# Patient Record
Sex: Male | Born: 1950 | Race: White | Hispanic: No | Marital: Married
Health system: Southern US, Community
[De-identification: ages and names within clinical notes are randomized; demographics above are authoritative.]

## PROBLEM LIST (undated history)

## (undated) DIAGNOSIS — I824Z3 Acute embolism and thrombosis of unspecified deep veins of distal lower extremity, bilateral: Secondary | ICD-10-CM

## (undated) DIAGNOSIS — J9621 Acute and chronic respiratory failure with hypoxia: Secondary | ICD-10-CM

## (undated) DIAGNOSIS — N170 Acute kidney failure with tubular necrosis: Secondary | ICD-10-CM

## (undated) DIAGNOSIS — G931 Anoxic brain damage, not elsewhere classified: Secondary | ICD-10-CM

## (undated) DIAGNOSIS — I619 Nontraumatic intracerebral hemorrhage, unspecified: Secondary | ICD-10-CM

---

## 2018-10-19 ENCOUNTER — Institutional Professional Consult (permissible substitution) (HOSPITAL_COMMUNITY): Payer: Medicare Other

## 2018-10-19 ENCOUNTER — Inpatient Hospital Stay
Admission: AD | Admit: 2018-10-19 | Discharge: 2018-11-24 | Disposition: A | Discharge status: Skilled Nursing Facility | Payer: Medicare Other | Source: Other Acute Inpatient Hospital | Attending: Internal Medicine | Admitting: Internal Medicine

## 2018-10-19 ENCOUNTER — Other Ambulatory Visit (HOSPITAL_COMMUNITY): Payer: Medicare Other

## 2018-10-19 DIAGNOSIS — G931 Anoxic brain damage, not elsewhere classified: Secondary | ICD-10-CM | POA: Diagnosis not present

## 2018-10-19 DIAGNOSIS — R042 Hemoptysis: Secondary | ICD-10-CM

## 2018-10-19 DIAGNOSIS — J9690 Respiratory failure, unspecified, unspecified whether with hypoxia or hypercapnia: Secondary | ICD-10-CM

## 2018-10-19 DIAGNOSIS — N170 Acute kidney failure with tubular necrosis: Secondary | ICD-10-CM

## 2018-10-19 DIAGNOSIS — N179 Acute kidney failure, unspecified: Secondary | ICD-10-CM

## 2018-10-19 DIAGNOSIS — I824Z3 Acute embolism and thrombosis of unspecified deep veins of distal lower extremity, bilateral: Secondary | ICD-10-CM | POA: Diagnosis not present

## 2018-10-19 DIAGNOSIS — J9621 Acute and chronic respiratory failure with hypoxia: Secondary | ICD-10-CM | POA: Diagnosis not present

## 2018-10-19 DIAGNOSIS — J189 Pneumonia, unspecified organism: Secondary | ICD-10-CM

## 2018-10-19 DIAGNOSIS — Z931 Gastrostomy status: Secondary | ICD-10-CM

## 2018-10-19 DIAGNOSIS — I619 Nontraumatic intracerebral hemorrhage, unspecified: Secondary | ICD-10-CM

## 2018-10-19 HISTORY — DX: Acute kidney failure with tubular necrosis: N17.0

## 2018-10-19 HISTORY — DX: Anoxic brain damage, not elsewhere classified: G93.1

## 2018-10-19 HISTORY — DX: Acute and chronic respiratory failure with hypoxia: J96.21

## 2018-10-19 HISTORY — DX: Nontraumatic intracerebral hemorrhage, unspecified: I61.9

## 2018-10-19 HISTORY — DX: Acute embolism and thrombosis of unspecified deep veins of distal lower extremity, bilateral: I82.4Z3

## 2018-10-19 LAB — URINALYSIS, ROUTINE W REFLEX MICROSCOPIC
Bilirubin Urine: NEGATIVE
Glucose, UA: NEGATIVE mg/dL
Ketones, ur: NEGATIVE mg/dL
Nitrite: POSITIVE — AB
Protein, ur: NEGATIVE mg/dL
Specific Gravity, Urine: 1.015 (ref 1.005–1.030)
pH: 5 (ref 5.0–8.0)

## 2018-10-19 LAB — TSH: TSH: 3.752 u[IU]/mL (ref 0.350–4.500)

## 2018-10-19 MED ORDER — IOPAMIDOL (ISOVUE-300) INJECTION 61%: 50.0000 mL | Freq: Once | INTRAVENOUS | Status: DC | PRN

## 2018-10-19 MED ORDER — IOPAMIDOL (ISOVUE-300) INJECTION 61%
INTRAVENOUS | Status: AC
  Administered 2018-10-19: 50 mL via GASTROSTOMY
  Filled 2018-10-19: qty 50

## 2018-10-19 NOTE — Consult Note (Signed)
[facility]  Date of Service: 10/19/2018  PULMONARY CRITICAL CARE CONSULT   Darryl Vargas  [id number]  DOB: 26-Aug-1951   DOA: 10/19/2018  Referring Physician: Carron CurieAli Hijazi, MD  HPI: Darryl KronerCharles D Vargas is a 67 y.o. male seen for follow up of Acute on Chronic Respiratory Failure.  Patient transferred to our facility for further management and weaning.  [facility].  Apparently patient was witnessed by bystanders brought to the emergency department at that time was noted to have a right-sided hemiplegia.  Patient was initially intubated for basically airway protection.  Head CT revealed intraparenchymal hemorrhage and hydrocephalus.  There was significant cerebral edema and herniation so the patient was treated with hypertonic saline as well as mannitol.  Patient did have a EVD placed for reduction of hydrocephalus.  And also underwent evacuation of the intracerebral hemorrhage.  Currently he is on the ventilator he did not wean off the ventilator and eventually ended up needing to be undergoing a tracheostomy.  Review of Systems:  ROS performed and is unremarkable other than noted above.  Past medical history  acute stroke  hypertension Encephalopathy Acute renal failure DVT Chronic anemia Hypernatremia  Past surgical history EVD placement Tracheostomy Drainage of intraparenchymal bleed  Family history Is noncontributory.  Social history Unable to obtain patient is nonverbal.  Medications: Reviewed on Rounds  Physical Exam:  Vitals: Temperature 100.2 pulse 83 respiratory rate 34 blood pressure 134/63 saturations 96%  Ventilator Settings mode ventilation pressure assist control FiO2 35% tidal volume 520 PEEP 5 inspiratory pressure 12  . General: Comfortable at this time . Eyes: Grossly normal lids, irises & conjunctiva . ENT: grossly tongue is  normal . Neck: no obvious mass . Cardiovascular: S1-S2 normal no gallop or rub . Respiratory: Coarse breath sounds few rhonchi . Abdomen: Soft nontender . Skin: no rash seen on limited exam . Musculoskeletal: not rigid . Psychiatric:unable to assess . Neurologic: no seizure no involuntary movements         Labs on Admission:  Basic Metabolic Panel: No results for input(s): NA, K, CL, CO2, GLUCOSE, BUN, CREATININE, CALCIUM, MG, PHOS in the last 168 hours.  No results for input(s): PHART, PCO2ART, PO2ART, HCO3, O2SAT in the last 168 hours.  Liver Function Tests: No results for input(s): AST, ALT, ALKPHOS, BILITOT, PROT, ALBUMIN in the last 168 hours. No results for input(s): LIPASE, AMYLASE in the last 168 hours. No results for input(s): AMMONIA in the last 168 hours.  CBC: No results for input(s): WBC, NEUTROABS, HGB, HCT, MCV, PLT in the last 168 hours.  Cardiac Enzymes: No results for input(s): CKTOTAL, CKMB, CKMBINDEX, TROPONINI in the last 168 hours.  BNP (last 3 results) No results for input(s): BNP in the last 8760 hours.  ProBNP (last 3 results) No results for input(s): PROBNP in the last 8760 hours.   Radiological Exams on Admission: No results found.  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 Acute on chronic respiratory failure with hypoxia patient right now has a #8 Shiley trach in place.  Remains on the ventilator and full support pressure control.  Respiratory therapy will assess weaning parameters and begin the weaning if at all  able to tolerate. 2. Acute stroke with intraparenchymal hemorrhage patient right now remains unresponsive.  He is going to need physical therapy once he is more awake. 3. Acute renal failure we  will continue to monitor his labs closely. 4. Bilateral DVT patient was not a candidate for anticoagulation so had a IVC filter placed 5. Encephalopathy patient remains essentially nonverbal nonresponsive.  I have personally seen and evaluated the patient, evaluated laboratory and imaging results, formulated the assessment and plan and placed orders. The Patient requires high complexity decision making for assessment and support.  Case was discussed on Rounds with the Respiratory Therapy Staff Time Spent 70minutes  Yevonne PaxSaadat A Khan, MD [facility] Pulmonary Critical Care Medicine Sleep Medicine

## 2018-10-20 ENCOUNTER — Encounter: Payer: Self-pay | Admitting: Internal Medicine

## 2018-10-20 DIAGNOSIS — I824Z3 Acute embolism and thrombosis of unspecified deep veins of distal lower extremity, bilateral: Secondary | ICD-10-CM | POA: Diagnosis present

## 2018-10-20 DIAGNOSIS — G931 Anoxic brain damage, not elsewhere classified: Secondary | ICD-10-CM | POA: Diagnosis not present

## 2018-10-20 DIAGNOSIS — N170 Acute kidney failure with tubular necrosis: Secondary | ICD-10-CM | POA: Diagnosis not present

## 2018-10-20 DIAGNOSIS — I619 Nontraumatic intracerebral hemorrhage, unspecified: Secondary | ICD-10-CM | POA: Diagnosis present

## 2018-10-20 DIAGNOSIS — J9621 Acute and chronic respiratory failure with hypoxia: Secondary | ICD-10-CM | POA: Diagnosis present

## 2018-10-20 LAB — COMPREHENSIVE METABOLIC PANEL
ALT: 149 U/L — ABNORMAL HIGH (ref 0–44)
AST: 98 U/L — ABNORMAL HIGH (ref 15–41)
Albumin: 2.4 g/dL — ABNORMAL LOW (ref 3.5–5.0)
Alkaline Phosphatase: 70 U/L (ref 38–126)
BUN: 97 mg/dL — ABNORMAL HIGH (ref 8–23)
CALCIUM: 9.3 mg/dL (ref 8.9–10.3)
CO2: 16 mmol/L — ABNORMAL LOW (ref 22–32)
CREATININE: 1.65 mg/dL — AB (ref 0.61–1.24)
Chloride: >130 mmol/L (ref 98–111)
GFR calc Af Amer: 50 mL/min — ABNORMAL LOW (ref >60)
GFR, EST NON AFRICAN AMERICAN: 43 mL/min — AB (ref >60)
Glucose, Bld: 178 mg/dL — ABNORMAL HIGH (ref 70–99)
Potassium: 4.9 mmol/L (ref 3.5–5.1)
Sodium: 160 mmol/L — ABNORMAL HIGH (ref 135–145)
Total Bilirubin: 0.6 mg/dL (ref 0.3–1.2)
Total Protein: 6.3 g/dL — ABNORMAL LOW (ref 6.5–8.1)

## 2018-10-20 LAB — PROTIME-INR
INR: 1.35
Prothrombin Time: 16.5 seconds — ABNORMAL HIGH (ref 11.4–15.2)

## 2018-10-20 LAB — CBC
HCT: 36.2 % — ABNORMAL LOW (ref 39.0–52.0)
Hemoglobin: 11.2 g/dL — ABNORMAL LOW (ref 13.0–17.0)
MCH: 30.9 pg (ref 26.0–34.0)
MCHC: 30.9 g/dL (ref 30.0–36.0)
MCV: 100 fL (ref 80.0–100.0)
PLATELETS: 276 10*3/uL (ref 150–400)
RBC: 3.62 MIL/uL — ABNORMAL LOW (ref 4.22–5.81)
RDW: 14.7 % (ref 11.5–15.5)
WBC: 12.1 10*3/uL — ABNORMAL HIGH (ref 4.0–10.5)
nRBC: 0 % (ref 0.0–0.2)

## 2018-10-20 LAB — BLOOD GAS, ARTERIAL
Acid-base deficit: 9.9 mmol/L — ABNORMAL HIGH (ref 0.0–2.0)
Bicarbonate: 14.2 mmol/L — ABNORMAL LOW (ref 20.0–28.0)
FIO2: 35
O2 Saturation: 94.5 %
PEEP: 5 cmH2O
Patient temperature: 98.6
Pressure control: 33 cmH2O
RATE: 12 resp/min
pCO2 arterial: 24.8 mmHg — ABNORMAL LOW (ref 32.0–48.0)
pH, Arterial: 7.377 (ref 7.350–7.450)
pO2, Arterial: 75.6 mmHg — ABNORMAL LOW (ref 83.0–108.0)

## 2018-10-20 NOTE — Progress Notes (Signed)
Pulmonary Critical Care Medicine [place]   PULMONARY CRITICAL CARE SERVICE  PROGRESS NOTE  Date of Service: 10/20/2018  HJALMAR BALLENGEE  VQM:[id number]  DOB: May 27, 1951   DOA: 10/19/2018  Referring Physician: Merton Border, MD  HPI: DOMINICO ROD is a 67 y.o. male seen for follow up of Acute on Chronic Respiratory Failure.  Patient is on full vent support.  Still somewhat tachypneic.  Patient is at a poor spontaneous breathing trial not able to wean.  Remains on pressure assist control mode  Medications: Reviewed on Rounds  Physical Exam:  Vitals: Temperature 98.9 pulse 75 respiratory 35 blood pressure 160/86 saturations 94%  Ventilator Settings mode ventilation pressure assist control FiO2 30% tidal volume 458 PEEP 5  . General: Comfortable at this time . Eyes: Grossly normal lids, irises & conjunctiva . ENT: grossly tongue is normal . Neck: no obvious mass . Cardiovascular: S1 S2 normal no gallop . Respiratory: No rhonchi or rales are noted at this time . Abdomen: soft . Skin: no rash seen on limited exam . Musculoskeletal: not rigid . Psychiatric:unable to assess . Neurologic: no seizure no involuntary movements         Lab Data:   Basic Metabolic Panel: Recent Labs  Lab 10/20/18 0700  NA 160*  K 4.9  CL >130*  CO2 16*  GLUCOSE 178*  BUN 97*  CREATININE 1.65*  CALCIUM 9.3    ABG: Recent Labs  Lab 10/19/18 1530  PHART 7.377  PCO2ART 24.8*  PO2ART 75.6*  HCO3 14.2*  O2SAT 94.5    Liver Function Tests: Recent Labs  Lab 10/20/18 0700  AST 98*  ALT 149*  ALKPHOS 70  BILITOT 0.6  PROT 6.3*  ALBUMIN 2.4*   No results for input(s): LIPASE, AMYLASE in the last 168 hours. No results for input(s): AMMONIA in the last 168 hours.  CBC: Recent Labs  Lab 10/20/18 0700  WBC 12.1*  HGB 11.2*  HCT 36.2*  MCV 100.0  PLT 276    Cardiac Enzymes: No results for input(s): CKTOTAL, CKMB, CKMBINDEX, TROPONINI in the last 168  hours.  BNP (last 3 results) No results for input(s): BNP in the last 8760 hours.  ProBNP (last 3 results) No results for input(s): PROBNP in the last 8760 hours.  Radiological Exams: Dg Abdomen Peg Tube Location  Result Date: 10/19/2018 CLINICAL DATA:  Evaluate PEG tube. 50 cc of Isovue-300 injected through the PEG tube. EXAM: ABDOMEN - 1 VIEW COMPARISON:  None. FINDINGS: The PEG tube projects over the gastric body. The gastric fundus is filled with contrast. No evidence of leak. The contrast remains in the study and is not exited into the small bowel. IVC filter. Suspected left basilar opacity, incompletely evaluated. IMPRESSION: 1. The PEG tube appears to be located within the body of the stomach. No evidence of leak. 2. Suspected opacity in left base, incompletely evaluated. Recommend attention to this region on today's chest x-ray. Electronically Signed   By: Dorise Bullion III M.D   On: 10/19/2018 15:06   Dg Chest Port 1 View  Result Date: 10/19/2018 CLINICAL DATA:  Fever.  Evaluate tracheostomy. EXAM: PORTABLE CHEST 1 VIEW COMPARISON:  None. FINDINGS: Tracheostomy appropriately position with the tip at the thoracic inlet, 8.5 cm above the carina. The heart size and mediastinal contours are within normal limits. Normal pulmonary vascularity. Retrocardiac left lower lobe airspace disease. Trace left pleural effusion. The right lung is clear. No pneumothorax. No acute osseous abnormality. IMPRESSION: 1. Appropriately  positioned tracheostomy tube. 2. Left lower lobe atelectasis and/or infiltrate. 3. Trace left pleural effusion. Electronically Signed   By: Titus Dubin M.D.   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 Acute  on chronic respiratory failure with hypoxia patient remains on the ventilator so far weaning attempts have been met with failure.  We will continue to assess daily. 2. Nontraumatic intracranial hemorrhage at baseline remains nonverbal. 3. Acute tubular necrosis continue to monitor labs last BUN was 97 creatinine 1.65 4. DVT has IVC filter in place 5. Acute anoxic encephalopathy grossly unchanged we will continue to monitor.   I have personally seen and evaluated the patient, evaluated laboratory and imaging results, formulated the assessment and plan and placed orders. The Patient requires high complexity decision making for assessment and support.  Case was discussed on Rounds with the Respiratory Therapy Staff  Allyne Gee, MD [facility] Pulmonary Critical Care Medicine Sleep Medicine

## 2018-10-21 DIAGNOSIS — I824Z3 Acute embolism and thrombosis of unspecified deep veins of distal lower extremity, bilateral: Secondary | ICD-10-CM | POA: Diagnosis not present

## 2018-10-21 DIAGNOSIS — G931 Anoxic brain damage, not elsewhere classified: Secondary | ICD-10-CM | POA: Diagnosis not present

## 2018-10-21 DIAGNOSIS — N170 Acute kidney failure with tubular necrosis: Secondary | ICD-10-CM | POA: Diagnosis not present

## 2018-10-21 DIAGNOSIS — J9621 Acute and chronic respiratory failure with hypoxia: Secondary | ICD-10-CM | POA: Diagnosis not present

## 2018-10-21 LAB — BLOOD GAS, ARTERIAL
Acid-base deficit: 12.6 mmol/L — ABNORMAL HIGH (ref 0.0–2.0)
BICARBONATE: 12.1 mmol/L — AB (ref 20.0–28.0)
FIO2: 30
O2 Saturation: 97.8 %
PEEP/CPAP: 5 cmH2O
PH ART: 7.333 — AB (ref 7.350–7.450)
Patient temperature: 98.6
Pressure control: 24 cmH2O
RATE: 20 resp/min
pCO2 arterial: 23.5 mmHg — ABNORMAL LOW (ref 32.0–48.0)
pO2, Arterial: 106 mmHg (ref 83.0–108.0)

## 2018-10-21 LAB — URINE CULTURE: Culture: >=100000 — AB

## 2018-10-21 NOTE — Progress Notes (Signed)
[facility]   PULMONARY CRITICAL CARE SERVICE  PROGRESS NOTE  Date of Service: 10/21/2018  Darryl KronerCharles D Vargas  GNF:[id number]  DOB: 1951/08/09   DOA: 10/19/2018  Referring Physician: Carron CurieAli Hijazi, MD  HPI: Darryl KronerCharles D Darryl Vargas is a 67 y.o. male seen for follow up of Acute on Chronic Respiratory Failure.  Patient is on pressure support mode currently has been on 30% FiO2 tolerating the wean fairly well  Medications: Reviewed on Rounds  Physical Exam:  Vitals: Temperature 99.7 pulse 82 respiratory rate 33 blood pressure 132/75 saturations 95%  Ventilator Settings mode of ventilation pressure support FiO2 30% tidal volume 610 pressure support 12 PEEP 5  . General: Comfortable at this time . Eyes: Grossly normal lids, irises & conjunctiva . ENT: grossly tongue is normal . Neck: no obvious mass . Cardiovascular: S1 S2 normal no gallop . Respiratory: No rhonchi or rales are noted at this time . Abdomen: soft . Skin: no rash seen on limited exam . Musculoskeletal: not rigid . Psychiatric:unable to assess . Neurologic: no seizure no involuntary movements         Lab Data:   Basic Metabolic Panel: Recent Labs  Lab 10/20/18 0700  NA 160*  K 4.9  CL >130*  CO2 16*  GLUCOSE 178*  BUN 97*  CREATININE 1.65*  CALCIUM 9.3    ABG: Recent Labs  Lab 10/19/18 1530  PHART 7.377  PCO2ART 24.8*  PO2ART 75.6*  HCO3 14.2*  O2SAT 94.5    Liver Function Tests: Recent Labs  Lab 10/20/18 0700  AST 98*  ALT 149*  ALKPHOS 70  BILITOT 0.6  PROT 6.3*  ALBUMIN 2.4*   No results for input(s): LIPASE, AMYLASE in the last 168 hours. No results for input(s): AMMONIA in the last 168 hours.  CBC: Recent Labs  Lab 10/20/18 0700  WBC 12.1*  HGB 11.2*  HCT 36.2*  MCV 100.0  PLT 276    Cardiac Enzymes: No results for input(s): CKTOTAL, CKMB, CKMBINDEX, TROPONINI in the last 168 hours.  BNP (last 3 results) No results for  input(s): BNP in the last 8760 hours.  ProBNP (last 3 results) No results for input(s): PROBNP in the last 8760 hours.  Radiological Exams: Dg Abdomen Peg Tube Location  Result Date: 10/19/2018 CLINICAL DATA:  Evaluate PEG tube. 50 cc of Isovue-300 injected through the PEG tube. EXAM: ABDOMEN - 1 VIEW COMPARISON:  None. FINDINGS: The PEG tube projects over the gastric body. The gastric fundus is filled with contrast. No evidence of leak. The contrast remains in the study and is not exited into the small bowel. IVC filter. Suspected left basilar opacity, incompletely evaluated. IMPRESSION: 1. The PEG tube appears to be located within the body of the stomach. No evidence of leak. 2. Suspected opacity in left base, incompletely evaluated. Recommend attention to this region on today's chest x-ray. Electronically Signed   By: Gerome Samavid  Williams III M.D   On: 10/19/2018 15:06   Dg Chest Port 1 View  Result Date: 10/19/2018 CLINICAL DATA:  Fever.  Evaluate tracheostomy. EXAM: PORTABLE CHEST 1 VIEW COMPARISON:  None. FINDINGS: Tracheostomy appropriately position with the tip at the thoracic inlet, 8.5 cm above the carina. The heart size and mediastinal contours are within normal limits. Normal pulmonary vascularity. Retrocardiac left lower lobe airspace disease. Trace left pleural effusion. The right lung is clear. No pneumothorax. No acute osseous abnormality. IMPRESSION: 1. Appropriately positioned tracheostomy tube. 2. Left lower lobe atelectasis and/or  infiltrate. 3. Trace left pleural effusion. Electronically Signed   By: Obie DredgeWilliam T Derry M.D.   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 Acute on chronic respiratory failure with hypoxia we  will continue with the wean protocol 12/5 pressure support 2. Nontraumatic intracranial hemorrhage patient is at baseline needs ongoing therapy. 3. Acute tubular necrosis patient elevated BUN/creatinine continue to monitor. 4. DVT treated we will continue with supportive care. 5. Lobar pneumonia some atelectasis versus infiltrate noted on the left lower lobe.  We will monitor this radiologically   I have personally seen and evaluated the patient, evaluated laboratory and imaging results, formulated the assessment and plan and placed orders. The Patient requires high complexity decision making for assessment and support.  Case was discussed on Rounds with the Respiratory Therapy Staff  Yevonne PaxSaadat A Erlean Mealor, MD [facility] Pulmonary Critical Care Medicine Sleep Medicine

## 2018-10-22 DIAGNOSIS — G931 Anoxic brain damage, not elsewhere classified: Secondary | ICD-10-CM | POA: Diagnosis not present

## 2018-10-22 DIAGNOSIS — J9621 Acute and chronic respiratory failure with hypoxia: Secondary | ICD-10-CM | POA: Diagnosis not present

## 2018-10-22 DIAGNOSIS — N170 Acute kidney failure with tubular necrosis: Secondary | ICD-10-CM | POA: Diagnosis not present

## 2018-10-22 DIAGNOSIS — I824Z3 Acute embolism and thrombosis of unspecified deep veins of distal lower extremity, bilateral: Secondary | ICD-10-CM | POA: Diagnosis not present

## 2018-10-22 LAB — CBC
HCT: 36.5 % — ABNORMAL LOW (ref 39.0–52.0)
Hemoglobin: 11.2 g/dL — ABNORMAL LOW (ref 13.0–17.0)
MCH: 30.4 pg (ref 26.0–34.0)
MCHC: 30.7 g/dL (ref 30.0–36.0)
MCV: 99.2 fL (ref 80.0–100.0)
Platelets: 268 10*3/uL (ref 150–400)
RBC: 3.68 MIL/uL — ABNORMAL LOW (ref 4.22–5.81)
RDW: 14.6 % (ref 11.5–15.5)
WBC: 14.9 10*3/uL — ABNORMAL HIGH (ref 4.0–10.5)
nRBC: 0 % (ref 0.0–0.2)

## 2018-10-22 LAB — BLOOD GAS, ARTERIAL
Acid-base deficit: 13.5 mmol/L — ABNORMAL HIGH (ref 0.0–2.0)
Bicarbonate: 11.8 mmol/L — ABNORMAL LOW (ref 20.0–28.0)
FIO2: 0.3
O2 Saturation: 95.7 %
PEEP: 5 cmH2O
PO2 ART: 83.6 mmHg (ref 83.0–108.0)
Patient temperature: 98.6
Pressure control: 24 cmH2O
RATE: 16 resp/min
pCO2 arterial: 24.9 mmHg — ABNORMAL LOW (ref 32.0–48.0)
pH, Arterial: 7.295 — ABNORMAL LOW (ref 7.350–7.450)

## 2018-10-22 LAB — BASIC METABOLIC PANEL
Anion gap: 17 — ABNORMAL HIGH (ref 5–15)
BUN: 164 mg/dL — ABNORMAL HIGH (ref 8–23)
CO2: 13 mmol/L — ABNORMAL LOW (ref 22–32)
Calcium: 9 mg/dL (ref 8.9–10.3)
Chloride: 121 mmol/L — ABNORMAL HIGH (ref 98–111)
Creatinine, Ser: 4.22 mg/dL — ABNORMAL HIGH (ref 0.61–1.24)
GFR calc Af Amer: 16 mL/min — ABNORMAL LOW (ref >60)
GFR calc non Af Amer: 14 mL/min — ABNORMAL LOW (ref >60)
GLUCOSE: 198 mg/dL — AB (ref 70–99)
Potassium: 6.6 mmol/L (ref 3.5–5.1)
Sodium: 151 mmol/L — ABNORMAL HIGH (ref 135–145)

## 2018-10-22 NOTE — Progress Notes (Signed)
[facility]   PULMONARY CRITICAL CARE SERVICE  PROGRESS NOTE  Date of Service: 10/22/2018  Darryl KronerCharles D Daris  ZOX:[id number]  DOB: 1951/08/21   DOA: 10/19/2018  Referring Physician: Carron CurieAli Hijazi, MD  HPI: Darryl Vargas is a 67 y.o. male seen for follow up of Acute on Chronic Respiratory Failure.  Patient had a difficult night overnight is on pressure control mode right now is on 30% oxygen with a PEEP of 5  Medications: Reviewed on Rounds  Physical Exam:  Vitals: Temperature 98.6 pulse 78 respiratory 28 blood pressure 101/62 saturations 96%  Ventilator Settings mode ventilation pressure assist control FiO2 30% tidal volume 800 PEEP 5  . General: Comfortable at this time . Eyes: Grossly normal lids, irises & conjunctiva . ENT: grossly tongue is normal . Neck: no obvious mass . Cardiovascular: S1 S2 normal no gallop . Respiratory: Coarse breath sounds noted bilaterally . Abdomen: soft . Skin: no rash seen on limited exam . Musculoskeletal: not rigid . Psychiatric:unable to assess . Neurologic: no seizure no involuntary movements         Lab Data:   Basic Metabolic Panel: Recent Labs  Lab 10/20/18 0700 10/22/18 0651  NA 160* 151*  K 4.9 6.6*  CL >130* 121*  CO2 16* 13*  GLUCOSE 178* 198*  BUN 97* 164*  CREATININE 1.65* 4.22*  CALCIUM 9.3 9.0    ABG: Recent Labs  Lab 10/19/18 1530 10/21/18 2126 10/22/18 0410  PHART 7.377 7.333* 7.295*  PCO2ART 24.8* 23.5* 24.9*  PO2ART 75.6* 106 83.6  HCO3 14.2* 12.1* 11.8*  O2SAT 94.5 97.8 95.7    Liver Function Tests: Recent Labs  Lab 10/20/18 0700  AST 98*  ALT 149*  ALKPHOS 70  BILITOT 0.6  PROT 6.3*  ALBUMIN 2.4*   No results for input(s): LIPASE, AMYLASE in the last 168 hours. No results for input(s): AMMONIA in the last 168 hours.  CBC: Recent Labs  Lab 10/20/18 0700 10/22/18 0651  WBC 12.1* 14.9*  HGB 11.2* 11.2*  HCT 36.2* 36.5*  MCV  100.0 99.2  PLT 276 268    Cardiac Enzymes: No results for input(s): CKTOTAL, CKMB, CKMBINDEX, TROPONINI in the last 168 hours.  BNP (last 3 results) No results for input(s): BNP in the last 8760 hours.  ProBNP (last 3 results) No results for input(s): PROBNP in the last 8760 hours.  Radiological Exams: No results found.  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 Acute on chronic respiratory failure with hypoxia patient is still significantly dehydrated with elevated sodium as well as BUN and creatinine.  The patient is being hydrated.  Also was noted to have urinary flow obstruction relieved by catheterization.  We will continue with supportive care hopefully we should see an improvement in the blood gases as the acidosis improves from the renal issues 2. Nontraumatic intracranial hemorrhage continue with supportive care patient is nonverbal. 3. Acute tubular necrosis likely obstructive uropathy 4. DVT treated 5. Acute anoxic encephalopathy grossly unchanged we will continue to monitor   I have personally seen and evaluated the patient, evaluated laboratory and imaging results, formulated the assessment and plan and placed orders. The Patient requires high complexity decision making for assessment and support.  Case was discussed on  Rounds with the Respiratory Therapy Staff  Allyne Gee, MD [facility] Pulmonary Critical Care Medicine Sleep Medicine

## 2018-10-23 DIAGNOSIS — J9621 Acute and chronic respiratory failure with hypoxia: Secondary | ICD-10-CM | POA: Diagnosis not present

## 2018-10-23 DIAGNOSIS — G931 Anoxic brain damage, not elsewhere classified: Secondary | ICD-10-CM | POA: Diagnosis not present

## 2018-10-23 DIAGNOSIS — I824Z3 Acute embolism and thrombosis of unspecified deep veins of distal lower extremity, bilateral: Secondary | ICD-10-CM | POA: Diagnosis not present

## 2018-10-23 DIAGNOSIS — N170 Acute kidney failure with tubular necrosis: Secondary | ICD-10-CM | POA: Diagnosis not present

## 2018-10-23 LAB — BASIC METABOLIC PANEL
Anion gap: 17 — ABNORMAL HIGH (ref 5–15)
BUN: 178 mg/dL — ABNORMAL HIGH (ref 8–23)
CO2: 14 mmol/L — AB (ref 22–32)
Calcium: 8.6 mg/dL — ABNORMAL LOW (ref 8.9–10.3)
Chloride: 117 mmol/L — ABNORMAL HIGH (ref 98–111)
Creatinine, Ser: 4.29 mg/dL — ABNORMAL HIGH (ref 0.61–1.24)
GFR calc Af Amer: 16 mL/min — ABNORMAL LOW (ref >60)
GFR calc non Af Amer: 14 mL/min — ABNORMAL LOW (ref >60)
Glucose, Bld: 198 mg/dL — ABNORMAL HIGH (ref 70–99)
Potassium: 4.3 mmol/L (ref 3.5–5.1)
Sodium: 148 mmol/L — ABNORMAL HIGH (ref 135–145)

## 2018-10-23 NOTE — Progress Notes (Signed)
[facility]   PULMONARY CRITICAL CARE SERVICE  PROGRESS NOTE  Date of Service: 10/23/2018  Darryl Vargas  ZOX:[id number]  DOB: 1950-12-09   DOA: 10/19/2018  Referring Physician: Carron CurieAli Hijazi, MD  HPI: Darryl Vargas is a 67 y.o. male seen for follow up of Acute on Chronic Respiratory Failure.  Patient is currently on pressure support mode has been on 30% FiO2.  Has intermittently been tolerating weaning.  He still has issues with his renal function nephrology needs to see the patient consultation was requested  Medications: Reviewed on Rounds  Physical Exam:  Vitals: Temperature 98.1 pulse 73 respiratory rate 24 blood pressure 135/73 saturations 96%  Ventilator Settings mode ventilation pressure support FiO2 30% pressure support 12 PEEP 5  . General: Comfortable at this time . Eyes: Grossly normal lids, irises & conjunctiva . ENT: grossly tongue is normal . Neck: no obvious mass . Cardiovascular: S1 S2 normal no gallop . Respiratory: No rhonchi or rales are noted at this time . Abdomen: soft . Skin: no rash seen on limited exam . Musculoskeletal: not rigid . Psychiatric:unable to assess . Neurologic: no seizure no involuntary movements         Lab Data:   Basic Metabolic Panel: Recent Labs  Lab 10/20/18 0700 10/22/18 0651 10/23/18 0430  NA 160* 151* 148*  K 4.9 6.6* 4.3  CL >130* 121* 117*  CO2 16* 13* 14*  GLUCOSE 178* 198* 198*  BUN 97* 164* 178*  CREATININE 1.65* 4.22* 4.29*  CALCIUM 9.3 9.0 8.6*    ABG: Recent Labs  Lab 10/19/18 1530 10/21/18 2126 10/22/18 0410  PHART 7.377 7.333* 7.295*  PCO2ART 24.8* 23.5* 24.9*  PO2ART 75.6* 106 83.6  HCO3 14.2* 12.1* 11.8*  O2SAT 94.5 97.8 95.7    Liver Function Tests: Recent Labs  Lab 10/20/18 0700  AST 98*  ALT 149*  ALKPHOS 70  BILITOT 0.6  PROT 6.3*  ALBUMIN 2.4*   No results for input(s): LIPASE, AMYLASE in the last 168 hours. No  results for input(s): AMMONIA in the last 168 hours.  CBC: Recent Labs  Lab 10/20/18 0700 10/22/18 0651  WBC 12.1* 14.9*  HGB 11.2* 11.2*  HCT 36.2* 36.5*  MCV 100.0 99.2  PLT 276 268    Cardiac Enzymes: No results for input(s): CKTOTAL, CKMB, CKMBINDEX, TROPONINI in the last 168 hours.  BNP (last 3 results) No results for input(s): BNP in the last 8760 hours.  ProBNP (last 3 results) No results for input(s): PROBNP in the last 8760 hours.  Radiological Exams: No results found.  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 Acute on chronic respiratory failure with hypoxia we will continue with pressure support mode currently on 30% FiO2 wean as tolerated. 2. Nontraumatic intracranial hemorrhage we will continue with supportive care physical therapy 3. Acute tubular necrosis patient's BUN and creatinine are still going up nephrology consultations been requested. 4. DVT treated 5. Anoxic encephalopathy grossly unchanged   I have personally seen and evaluated the patient, evaluated laboratory and imaging results, formulated the assessment and plan and placed orders. The Patient requires high complexity decision making for assessment and support.  Case was discussed on Rounds with the Respiratory Therapy Staff  Yevonne PaxSaadat A , MD  [facility] Pulmonary Critical Care Medicine Sleep Medicine

## 2018-10-24 DIAGNOSIS — G931 Anoxic brain damage, not elsewhere classified: Secondary | ICD-10-CM | POA: Diagnosis not present

## 2018-10-24 DIAGNOSIS — I824Z3 Acute embolism and thrombosis of unspecified deep veins of distal lower extremity, bilateral: Secondary | ICD-10-CM | POA: Diagnosis not present

## 2018-10-24 DIAGNOSIS — J9621 Acute and chronic respiratory failure with hypoxia: Secondary | ICD-10-CM | POA: Diagnosis not present

## 2018-10-24 DIAGNOSIS — N170 Acute kidney failure with tubular necrosis: Secondary | ICD-10-CM | POA: Diagnosis not present

## 2018-10-24 LAB — BASIC METABOLIC PANEL
Anion gap: 14 (ref 5–15)
BUN: 172 mg/dL — ABNORMAL HIGH (ref 8–23)
CHLORIDE: 120 mmol/L — AB (ref 98–111)
CO2: 15 mmol/L — ABNORMAL LOW (ref 22–32)
Calcium: 8.4 mg/dL — ABNORMAL LOW (ref 8.9–10.3)
Creatinine, Ser: 3.76 mg/dL — ABNORMAL HIGH (ref 0.61–1.24)
GFR calc Af Amer: 18 mL/min — ABNORMAL LOW (ref >60)
GFR calc non Af Amer: 16 mL/min — ABNORMAL LOW (ref >60)
Glucose, Bld: 185 mg/dL — ABNORMAL HIGH (ref 70–99)
Potassium: 4.3 mmol/L (ref 3.5–5.1)
Sodium: 149 mmol/L — ABNORMAL HIGH (ref 135–145)

## 2018-10-24 NOTE — Progress Notes (Signed)
[facility] [place]   PULMONARY CRITICAL CARE SERVICE  PROGRESS NOTE  Date of Service: 10/24/2018  Darryl Vargas  [id number]  DOB: [birth year]   DOA: 10/19/2018  Referring Physician: Carron CurieAli Hijazi, MD  HPI: Darryl Vargas is a 67 y.o. male seen for follow up of Acute on Chronic Respiratory Failure.  Patient is resting comfortably without distress at this time.  Is weaning today on pressure support doing a little bit better.  Right now is on 28% FiO2  Medications: Reviewed on Rounds  Physical Exam:  Vitals: Pitcher 97.7 pulse 77 respiratory 29 blood pressure 116/56 saturations 94%  Ventilator Settings mode of ventilation pressure support FiO2 28% tidal volume 400 pressure support 12 PEEP 5  . General: Comfortable at this time . Eyes: Grossly normal lids, irises & conjunctiva . ENT: grossly tongue is normal . Neck: no obvious mass . Cardiovascular: S1 S2 normal no gallop . Respiratory: No rhonchi or rales are noted at this time . Abdomen: soft . Skin: no rash seen on limited exam . Musculoskeletal: not rigid . Psychiatric:unable to assess . Neurologic: no seizure no involuntary movements         Lab Data:   Basic Metabolic Panel: Recent Labs  Lab 10/20/18 0700 10/22/18 0651 10/23/18 0430 10/24/18 0521  NA 160* 151* 148* 149*  K 4.9 6.6* 4.3 4.3  CL >130* 121* 117* 120*  CO2 16* 13* 14* 15*  GLUCOSE 178* 198* 198* 185*  BUN 97* 164* 178* 172*  CREATININE 1.65* 4.22* 4.29* 3.76*  CALCIUM 9.3 9.0 8.6* 8.4*    ABG: Recent Labs  Lab 10/19/18 1530 10/21/18 2126 10/22/18 0410  PHART 7.377 7.333* 7.295*  PCO2ART 24.8* 23.5* 24.9*  PO2ART 75.6* 106 83.6  HCO3 14.2* 12.1* 11.8*  O2SAT 94.5 97.8 95.7    Liver Function Tests: Recent Labs  Lab 10/20/18 0700  AST 98*  ALT 149*  ALKPHOS 70  BILITOT 0.6  PROT 6.3*  ALBUMIN 2.4*   No results for input(s): LIPASE, AMYLASE in the last 168 hours. No results  for input(s): AMMONIA in the last 168 hours.  CBC: Recent Labs  Lab 10/20/18 0700 10/22/18 0651  WBC 12.1* 14.9*  HGB 11.2* 11.2*  HCT 36.2* 36.5*  MCV 100.0 99.2  PLT 276 268    Cardiac Enzymes: No results for input(s): CKTOTAL, CKMB, CKMBINDEX, TROPONINI in the last 168 hours.  BNP (last 3 results) No results for input(s): BNP in the last 8760 hours.  ProBNP (last 3 results) No results for input(s): PROBNP in the last 8760 hours.  Radiological Exams: No results found.  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 Acute on chronic respiratory failure with hypoxia we will continue with the wean the goal is for 12 hours today 2. Nontraumatic intracranial hemorrhage at baseline continue with supportive care 3. Acute tubular necrosis labs appear to be improving 4. DVT treated 5. Anoxic encephalopathy grossly unchanged   I have personally seen and evaluated the patient, evaluated laboratory and imaging results, formulated the assessment and plan and placed orders. The Patient requires high complexity decision making for assessment and support.  Case was discussed on Rounds with the Respiratory Therapy Staff  Yevonne PaxSaadat A Khan, MD [facility] Pulmonary Critical Care Medicine  Sleep Medicine

## 2018-10-25 DIAGNOSIS — N170 Acute kidney failure with tubular necrosis: Secondary | ICD-10-CM | POA: Diagnosis not present

## 2018-10-25 DIAGNOSIS — G931 Anoxic brain damage, not elsewhere classified: Secondary | ICD-10-CM | POA: Diagnosis not present

## 2018-10-25 DIAGNOSIS — I824Z3 Acute embolism and thrombosis of unspecified deep veins of distal lower extremity, bilateral: Secondary | ICD-10-CM | POA: Diagnosis not present

## 2018-10-25 DIAGNOSIS — J9621 Acute and chronic respiratory failure with hypoxia: Secondary | ICD-10-CM | POA: Diagnosis not present

## 2018-10-25 LAB — BASIC METABOLIC PANEL
Anion gap: 9 (ref 5–15)
BUN: 159 mg/dL — ABNORMAL HIGH (ref 8–23)
CO2: 19 mmol/L — AB (ref 22–32)
Calcium: 8.7 mg/dL — ABNORMAL LOW (ref 8.9–10.3)
Chloride: 122 mmol/L — ABNORMAL HIGH (ref 98–111)
Creatinine, Ser: 3.03 mg/dL — ABNORMAL HIGH (ref 0.61–1.24)
GFR calc Af Amer: 24 mL/min — ABNORMAL LOW (ref >60)
GFR calc non Af Amer: 21 mL/min — ABNORMAL LOW (ref >60)
Glucose, Bld: 184 mg/dL — ABNORMAL HIGH (ref 70–99)
Potassium: 4.2 mmol/L (ref 3.5–5.1)
Sodium: 150 mmol/L — ABNORMAL HIGH (ref 135–145)

## 2018-10-25 NOTE — Progress Notes (Addendum)
Pulmonary Critical Care Medicine [facility] [place]   PULMONARY CRITICAL CARE SERVICE  PROGRESS NOTE  Date of Service: 10/25/2018  Darryl Vargas  [id number]:[id number]  DOB: 31-May-1951   DOA: 10/19/2018  Referring Physician: Carron Curie, MD  HPI: Darryl Vargas is a 68 y.o. male seen for follow up of Acute on Chronic Respiratory Failure.  Patient is doing well at this time on pressure support.  His goal today is 16 hours and currently he is doing well.  Minimal secretions noted.  Current FiO2 28%.  Medications: Reviewed on Rounds  Physical Exam:  Vitals: Pulse 70 respirations 25 blood pressure 140/72 O2 sat 95% temp 96.1  Ventilator Settings PSVT 12/5 FiO2 28%.  . General: Comfortable at this time . Eyes: Grossly normal lids, irises & conjunctiva . ENT: grossly tongue is normal . Neck: no obvious mass . Cardiovascular: S1 S2 normal no gallop . Respiratory: No wheezes or rhonchi noted . Abdomen: soft . Skin: no rash seen on limited exam . Musculoskeletal: not rigid . Psychiatric:unable to assess . Neurologic: no seizure no involuntary movements         Lab Data:   Basic Metabolic Panel: Recent Labs  Lab 10/20/18 0700 10/22/18 0651 10/23/18 0430 10/24/18 0521 10/25/18 0557  NA 160* 151* 148* 149* 150*  K 4.9 6.6* 4.3 4.3 4.2  CL >130* 121* 117* 120* 122*  CO2 16* 13* 14* 15* 19*  GLUCOSE 178* 198* 198* 185* 184*  BUN 97* 164* 178* 172* 159*  CREATININE 1.65* 4.22* 4.29* 3.76* 3.03*  CALCIUM 9.3 9.0 8.6* 8.4* 8.7*    ABG: Recent Labs  Lab 10/19/18 1530 10/21/18 2126 10/22/18 0410  PHART 7.377 7.333* 7.295*  PCO2ART 24.8* 23.5* 24.9*  PO2ART 75.6* 106 83.6  HCO3 14.2* 12.1* 11.8*  O2SAT 94.5 97.8 95.7    Liver Function Tests: Recent Labs  Lab 10/20/18 0700  AST 98*  ALT 149*  ALKPHOS 70  BILITOT 0.6  PROT 6.3*  ALBUMIN 2.4*   No results for input(s): LIPASE, AMYLASE in the last 168 hours. No results for input(s): AMMONIA in the  last 168 hours.  CBC: Recent Labs  Lab 10/20/18 0700 10/22/18 0651  WBC 12.1* 14.9*  HGB 11.2* 11.2*  HCT 36.2* 36.5*  MCV 100.0 99.2  PLT 276 268    Cardiac Enzymes: No results for input(s): CKTOTAL, CKMB, CKMBINDEX, TROPONINI in the last 168 hours.  BNP (last 3 results) No results for input(s): BNP in the last 8760 hours.  ProBNP (last 3 results) No results for input(s): PROBNP in the last 8760 hours.  Radiological Exams: No results found.  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 Acute on chronic respiratory failure with hypoxia continue weaning per protocol.  Goal today 16 hours on pressure support. 2. Nontraumatic intracranial hemorrhage at baseline continue supportive care 3. Acute tubular necrosis labs appear to be improving 4. DVT treated 5. Anorexic encephalopathy grossly unchanged   I have personally seen and evaluated the patient, evaluated laboratory and imaging results, formulated the assessment and plan and placed orders. The Patient requires high complexity decision making for assessment and support.  Case was discussed on Rounds with the Respiratory Therapy Staff  Yevonne Pax, MD [facility] Pulmonary Critical Care Medicine Sleep Medicine

## 2018-10-26 DIAGNOSIS — G931 Anoxic brain damage, not elsewhere classified: Secondary | ICD-10-CM | POA: Diagnosis not present

## 2018-10-26 DIAGNOSIS — N170 Acute kidney failure with tubular necrosis: Secondary | ICD-10-CM | POA: Diagnosis not present

## 2018-10-26 DIAGNOSIS — I824Z3 Acute embolism and thrombosis of unspecified deep veins of distal lower extremity, bilateral: Secondary | ICD-10-CM | POA: Diagnosis not present

## 2018-10-26 DIAGNOSIS — J9621 Acute and chronic respiratory failure with hypoxia: Secondary | ICD-10-CM | POA: Diagnosis not present

## 2018-10-26 LAB — SODIUM: Sodium: 151 mmol/L — ABNORMAL HIGH (ref 135–145)

## 2018-10-26 NOTE — Progress Notes (Signed)
Pulmonary Critical Care Medicine [facility] [place]   PULMONARY CRITICAL CARE SERVICE  PROGRESS NOTE  Date of Service: 10/26/2018  JAVONTE TONJES  DJM:[id number]  DOB: 10-08-1951   DOA: 10/19/2018  Referring Physician: Carron Curie, MD  HPI: Darryl Vargas is a 68 y.o. male seen for follow up of Acute on Chronic Respiratory Failure.  Patient is doing well at this time comfortable without distress.  Has been weaning on T collar currently is on 30% oxygen  Medications: Reviewed on Rounds  Physical Exam:  Vitals: Temperature 98.8 pulse 82 respiratory 27 blood pressure 138/66 saturations 96%  Ventilator Settings patient is off the ventilator right now on T collar FiO2 is 30%  . General: Comfortable at this time . Eyes: Grossly normal lids, irises & conjunctiva . ENT: grossly tongue is normal . Neck: no obvious mass . Cardiovascular: S1 S2 normal no gallop . Respiratory: Coarse breath sounds no rhonchi are noted . Abdomen: soft . Skin: no rash seen on limited exam . Musculoskeletal: not rigid . Psychiatric:unable to assess . Neurologic: no seizure no involuntary movements         Lab Data:   Basic Metabolic Panel: Recent Labs  Lab 10/20/18 0700 10/22/18 0651 10/23/18 0430 10/24/18 0521 10/25/18 0557 10/26/18 0434  NA 160* 151* 148* 149* 150* 151*  K 4.9 6.6* 4.3 4.3 4.2  --   CL >130* 121* 117* 120* 122*  --   CO2 16* 13* 14* 15* 19*  --   GLUCOSE 178* 198* 198* 185* 184*  --   BUN 97* 164* 178* 172* 159*  --   CREATININE 1.65* 4.22* 4.29* 3.76* 3.03*  --   CALCIUM 9.3 9.0 8.6* 8.4* 8.7*  --     ABG: Recent Labs  Lab 10/19/18 1530 10/21/18 2126 10/22/18 0410  PHART 7.377 7.333* 7.295*  PCO2ART 24.8* 23.5* 24.9*  PO2ART 75.6* 106 83.6  HCO3 14.2* 12.1* 11.8*  O2SAT 94.5 97.8 95.7    Liver Function Tests: Recent Labs  Lab 10/20/18 0700  AST 98*  ALT 149*  ALKPHOS 70  BILITOT 0.6  PROT 6.3*  ALBUMIN 2.4*   No results for input(s):  LIPASE, AMYLASE in the last 168 hours. No results for input(s): AMMONIA in the last 168 hours.  CBC: Recent Labs  Lab 10/20/18 0700 10/22/18 0651  WBC 12.1* 14.9*  HGB 11.2* 11.2*  HCT 36.2* 36.5*  MCV 100.0 99.2  PLT 276 268    Cardiac Enzymes: No results for input(s): CKTOTAL, CKMB, CKMBINDEX, TROPONINI in the last 168 hours.  BNP (last 3 results) No results for input(s): BNP in the last 8760 hours.  ProBNP (last 3 results) No results for input(s): PROBNP in the last 8760 hours.  Radiological Exams: No results found.  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 Acute on chronic respiratory failure with hypoxia we will continue with T collar trials continue secretion management pulmonary toilet. 2. Nontraumatic intracerebral hemorrhage of the basal ganglia we will continue with supportive care 3. Acute tubular necrosis follow-up on labs as necessary 4. Anoxic encephalopathy remains unchanged 5. DVT treated   I have personally seen and evaluated the patient, evaluated laboratory and imaging results, formulated the assessment and plan and placed orders. The Patient requires high complexity  decision making for assessment and support.  Case was discussed on Rounds with the Respiratory Therapy Staff  Allyne Gee, MD [facility] Pulmonary Critical Care Medicine Sleep Medicine

## 2018-10-27 ENCOUNTER — Other Ambulatory Visit (HOSPITAL_COMMUNITY): Payer: Medicare Other

## 2018-10-27 DIAGNOSIS — G931 Anoxic brain damage, not elsewhere classified: Secondary | ICD-10-CM | POA: Diagnosis not present

## 2018-10-27 DIAGNOSIS — I824Z3 Acute embolism and thrombosis of unspecified deep veins of distal lower extremity, bilateral: Secondary | ICD-10-CM | POA: Diagnosis not present

## 2018-10-27 DIAGNOSIS — N170 Acute kidney failure with tubular necrosis: Secondary | ICD-10-CM | POA: Diagnosis not present

## 2018-10-27 DIAGNOSIS — J9621 Acute and chronic respiratory failure with hypoxia: Secondary | ICD-10-CM | POA: Diagnosis not present

## 2018-10-27 LAB — BASIC METABOLIC PANEL
Anion gap: 9 (ref 5–15)
Anion gap: 8 (ref 5–15)
BUN: 116 mg/dL — ABNORMAL HIGH (ref 8–23)
BUN: 103 mg/dL — ABNORMAL HIGH (ref 8–23)
CO2: 19 mmol/L — ABNORMAL LOW (ref 22–32)
CO2: 18 mmol/L — ABNORMAL LOW (ref 22–32)
Calcium: 9 mg/dL (ref 8.9–10.3)
Calcium: 9 mg/dL (ref 8.9–10.3)
Chloride: 120 mmol/L — ABNORMAL HIGH (ref 98–111)
Chloride: 119 mmol/L — ABNORMAL HIGH (ref 98–111)
Creatinine, Ser: 2.17 mg/dL — ABNORMAL HIGH (ref 0.61–1.24)
Creatinine, Ser: 1.94 mg/dL — ABNORMAL HIGH (ref 0.61–1.24)
GFR calc Af Amer: 41 mL/min — ABNORMAL LOW (ref >60)
GFR, EST AFRICAN AMERICAN: 36 mL/min — AB (ref >60)
GFR, EST NON AFRICAN AMERICAN: 31 mL/min — AB (ref >60)
GFR, EST NON AFRICAN AMERICAN: 35 mL/min — AB (ref >60)
Glucose, Bld: 180 mg/dL — ABNORMAL HIGH (ref 70–99)
Glucose, Bld: 183 mg/dL — ABNORMAL HIGH (ref 70–99)
Potassium: 4.1 mmol/L (ref 3.5–5.1)
Potassium: 4 mmol/L (ref 3.5–5.1)
Sodium: 148 mmol/L — ABNORMAL HIGH (ref 135–145)
Sodium: 145 mmol/L (ref 135–145)

## 2018-10-27 LAB — CULTURE, RESPIRATORY W GRAM STAIN

## 2018-10-27 LAB — CULTURE, RESPIRATORY: CULTURE: NORMAL

## 2018-10-27 NOTE — Consult Note (Signed)
[facility] CONSULT NOTE    Date: 10/27/2018                  Patient Name:  Darryl Vargas  MRN: [id number]  DOB: 1951/04/17  Age / Sex: 68 y.o., male         PCP: Default, Provider, MD                 Service Requesting Consult: Hospitalist                 Reason for Consult: Acute renal failure, hypernatremia            History of Present Illness: Patient is a 68 y.o. male with a PMHx of CVA with intracranial hemorrhage s/p EVD, hypertension, encephalopathy, hypernatremia, acute respiratory failure, acute renal failure, hypernatremia who was admitted to [facility] on 10/19/2018 for ongoing care.  Patient had very severe intracranial hemorrhage that was accompanied with obstructive hydrocephalus.  Cerebral edema with herniation was noted.  Subsequently external ventricular drain was placed by neurosurgery.  He went evacuation for the intracerebral hemorrhage on October 06, 2018.  Patient is now status post tracheostomy, PEG tube placement.  He also had a DVT and IVC filter was placed.  We are asked to see him now for acute renal failure.  Today his serum sodium is down to 148 with a BUN of 116 and creatinine of 2.17.  BUN and creatinine were much higher earlier in the week.   Medications: D5W at 75 cc/h, amantadine 100 mg daily, amlodipine 10 mg daily, ciprofloxacin 500 mg twice daily, Colace 10 mL's twice daily, Pepcid 20 mg twice daily, fish oil 8 g daily, heparin 5000 units subcu every 8 hours, labetalol 200 mg every 8 hours, melatonin 3 mg nightly, modafinil 100 mg twice daily, pro-stat 30 cc twice daily, sertraline 175 mg daily, Flomax 0.4 mg daily.  Allergies: No Known Allergies    Past Medical History: Past Medical History:  Diagnosis Date  . Acute anoxic encephalopathy (HCC)   . Acute on chronic respiratory failure with hypoxia (HCC)   . Acute tubular necrosis (HCC)   . DVT, lower extremity, distal, acute, bilateral (HCC)   . Nontraumatic  intracerebral hemorrhage of basal ganglia (HCC)      Past Surgical History: Status post external ventricular drain PEG tube placement Tracheostomy placement  Family History: Unable to obtain from the patient as he has a tracheostomy in place.  Social History: Unable to obtain from the patient as he has a tracheostomy in place.  Review of Systems: Unable to obtain from the patient as he has tracheostomy in place.  Vital Signs: Temperature 97.9 pulse 65 respirations 23 blood pressure 137/69 Weight trends: There were no vitals filed for this visit.  Physical Exam: General: Critically ill appearing  Head: Surgical scar noted  Eyes: closed  Nose: Mucous membranes moist, not inflammed, nonerythematous.  Throat: Oral mucosa dry  Neck: Tracheostomy in place  Lungs:  Bilateral rhonchi, normal effort  Heart: RRR. S1 and S2 normal without gallop, murmur, or rubs.  Abdomen:  BS normoactive. Soft, Nondistended, non-tender.  No masses or organomegaly.  Extremities: Trace LE edema  Neurologic: Not arousable, not following commands  Skin: No visible rashes    Lab results: Basic Metabolic Panel: Recent Labs  Lab 10/24/18 0521 10/25/18 0557 10/26/18 0434 10/27/18 0614  NA 149* 150* 151* 148*  K 4.3 4.2  --  4.1  CL 120* 122*  --  120*  CO2 15* 19*  --  19*  GLUCOSE 185* 184*  --  180*  BUN 172* 159*  --  116*  CREATININE 3.76* 3.03*  --  2.17*  CALCIUM 8.4* 8.7*  --  9.0    Liver Function Tests: No results for input(s): AST, ALT, ALKPHOS, BILITOT, PROT, ALBUMIN in the last 168 hours. No results for input(s): LIPASE, AMYLASE in the last 168 hours. No results for input(s): AMMONIA in the last 168 hours.  CBC: Recent Labs  Lab 10/22/18 0651  WBC 14.9*  HGB 11.2*  HCT 36.5*  MCV 99.2  PLT 268    Cardiac Enzymes: No results for input(s): CKTOTAL, CKMB, CKMBINDEX, TROPONINI in the last 168 hours.  BNP: Invalid input(s): POCBNP  CBG: No results for input(s):  GLUCAP in the last 168 hours.  Microbiology: Results for orders placed or performed during the hospital encounter of 10/19/18  Culture, Urine     Status: Abnormal   Collection Time: 10/19/18  3:11 PM  Result Value Ref Range Status   Specimen Description URINE, RANDOM  Final   Special Requests   Final    NONE Performed at [facility], [address], [place], [place] [address]    Culture >=100,000 COLONIES/mL ENTEROBACTER AEROGENES (A)  Final   Report Status 10/21/2018 FINAL  Final   Organism ID, Bacteria ENTEROBACTER AEROGENES (A)  Final      Susceptibility   Enterobacter aerogenes - MIC*    CEFAZOLIN >=64 RESISTANT Resistant     CEFTRIAXONE <=1 SENSITIVE Sensitive     CIPROFLOXACIN <=0.25 SENSITIVE Sensitive     GENTAMICIN <=1 SENSITIVE Sensitive     IMIPENEM 2 SENSITIVE Sensitive     NITROFURANTOIN 128 RESISTANT Resistant     TRIMETH/SULFA <=20 SENSITIVE Sensitive     PIP/TAZO <=4 SENSITIVE Sensitive     * >=100,000 COLONIES/mL ENTEROBACTER AEROGENES  Culture, respiratory (non-expectorated)     Status: None (Preliminary result)   Collection Time: 10/24/18  2:43 PM  Result Value Ref Range Status   Specimen Description TRACHEAL ASPIRATE  Final   Special Requests NONE  Final   Gram Stain   Final    FEW WBC PRESENT, PREDOMINANTLY PMN RARE GRAM POSITIVE RODS    Culture   Final    CULTURE REINCUBATED FOR BETTER GROWTH Performed at [facility], [address] [place], [place] [address]    Report Status PENDING  Incomplete    Coagulation Studies: No results for input(s): LABPROT, INR in the last 72 hours.  Urinalysis: No results for input(s): COLORURINE, LABSPEC, PHURINE, GLUCOSEU, HGBUR, BILIRUBINUR, KETONESUR, PROTEINUR, UROBILINOGEN, NITRITE, LEUKOCYTESUR in the last 72 hours.  Invalid input(s): APPERANCEUR    Imaging:  No results found.   Assessment & Plan: Pt is a 68 y.o. male with a PMHx of CVA with intracranial hemorrhage s/p EVD, hypertension,  encephalopathy, hypernatremia, acute respiratory failure, acute renal failure, hypernatremia who was admitted to [facility] on 10/19/2018 for ongoing care.  1.  Acute renal failure, nonoliguric. 2.  Hypernatremia.  3.  Metabolic acidosis.  4.  Hypertension.   Plan:  We will consulted for evaluation management of acute renal failure as well as hyponatremia.  The patient's BUN was as high as 178 with a creatinine of 4.29.  Today BUN is down to 116 with a creatinine of 2.17.  Serum sodium remains high at 148.  We will obtain renal ultrasound to make sure there is no underlying obstruction.  In addition  we will increase D5W to 150 cc/h which should be administered over the weekend.  No indication for dialysis as the patient is making plenty of urine at this time.  Further plan as patient progresses.  Thanks for consultation.

## 2018-10-27 NOTE — Progress Notes (Signed)
Pulmonary Critical Care Medicine [facility] [place]   PULMONARY CRITICAL CARE SERVICE  PROGRESS NOTE  Date of Service: 10/27/2018  Darryl Vargas  [id number]  DOB: 05-08-1951   DOA: 10/19/2018  Referring Physician: Carron Curie, MD  HPI: Darryl Vargas is a 68 y.o. male seen for follow up of Acute on Chronic Respiratory Failure.  Patient is doing well right now on T collar has been on 30% FiO2 the goal is for about 8 hours today  Medications: Reviewed on Rounds  Physical Exam:  Vitals: Temperature 97.9 pulse 65 respiratory rate 23 blood pressure 137/69 saturations 96%  Ventilator Settings currently on T collar FiO2 30%  . General: Comfortable at this time . Eyes: Grossly normal lids, irises & conjunctiva . ENT: grossly tongue is normal . Neck: no obvious mass . Cardiovascular: S1 S2 normal no gallop . Respiratory: Coarse breath sounds no rhonchi . Abdomen: soft . Skin: no rash seen on limited exam . Musculoskeletal: not rigid . Psychiatric:unable to assess . Neurologic: no seizure no involuntary movements         Lab Data:   Basic Metabolic Panel: Recent Labs  Lab 10/22/18 0651 10/23/18 0430 10/24/18 0521 10/25/18 0557 10/26/18 0434 10/27/18 0614  NA 151* 148* 149* 150* 151* 148*  K 6.6* 4.3 4.3 4.2  --  4.1  CL 121* 117* 120* 122*  --  120*  CO2 13* 14* 15* 19*  --  19*  GLUCOSE 198* 198* 185* 184*  --  180*  BUN 164* 178* 172* 159*  --  116*  CREATININE 4.22* 4.29* 3.76* 3.03*  --  2.17*  CALCIUM 9.0 8.6* 8.4* 8.7*  --  9.0    ABG: Recent Labs  Lab 10/21/18 2126 10/22/18 0410  PHART 7.333* 7.295*  PCO2ART 23.5* 24.9*  PO2ART 106 83.6  HCO3 12.1* 11.8*  O2SAT 97.8 95.7    Liver Function Tests: No results for input(s): AST, ALT, ALKPHOS, BILITOT, PROT, ALBUMIN in the last 168 hours. No results for input(s): LIPASE, AMYLASE in the last 168 hours. No results for input(s): AMMONIA in the last 168 hours.  CBC: Recent Labs  Lab  10/22/18 0651  WBC 14.9*  HGB 11.2*  HCT 36.5*  MCV 99.2  PLT 268    Cardiac Enzymes: No results for input(s): CKTOTAL, CKMB, CKMBINDEX, TROPONINI in the last 168 hours.  BNP (last 3 results) No results for input(s): BNP in the last 8760 hours.  ProBNP (last 3 results) No results for input(s): PROBNP in the last 8760 hours.  Radiological Exams: US Renal  Result Date: 10/27/2018 CLINICAL DATA:  Acute renal failure EXAM: RENAL / URINARY TRACT ULTRASOUND COMPLETE COMPARISON:  None. FINDINGS: Right Kidney: Renal measurements: 12.1 x 5.9 x 5.8 cm. = volume: 216 mL. 1.4 cm cyst is noted within the right kidney arising from the lower pole. Left Kidney: Renal measurements: 12.5 x 6.6 x 7.6 cm. = volume: 307 mL. 1.7 cm cyst is noted arising in the lower pole of the left kidney. Bladder: Decompressed IMPRESSION: Bilateral renal cysts. No evidence of hydronephrosis. Electronically Signed   By: Alcide Clever M.D.   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 Acute on chronic respiratory failure with hypoxia we will  continue to wean on T collar as noted above the goal is 8 hours 2. Nontraumatic intracranial hemorrhage patient is doing about the same remains nonverbal 3. Acute tubular necrosis patient had a ultrasound of the kidney done without evidence of hydronephrosis nephrology is following 4. DVT treated we will continue with present management 5. Anoxic encephalopathy remains unchanged   I have personally seen and evaluated the patient, evaluated laboratory and imaging results, formulated the assessment and plan and placed orders. The Patient requires high complexity decision making for assessment and support.  Case was  discussed on Rounds with the Respiratory Therapy Staff  Yevonne Pax, MD [facility] Pulmonary Critical Care Medicine Sleep Medicine

## 2018-10-28 DIAGNOSIS — J9621 Acute and chronic respiratory failure with hypoxia: Secondary | ICD-10-CM | POA: Diagnosis not present

## 2018-10-28 DIAGNOSIS — N170 Acute kidney failure with tubular necrosis: Secondary | ICD-10-CM | POA: Diagnosis not present

## 2018-10-28 DIAGNOSIS — I824Z3 Acute embolism and thrombosis of unspecified deep veins of distal lower extremity, bilateral: Secondary | ICD-10-CM | POA: Diagnosis not present

## 2018-10-28 DIAGNOSIS — G931 Anoxic brain damage, not elsewhere classified: Secondary | ICD-10-CM | POA: Diagnosis not present

## 2018-10-28 LAB — BASIC METABOLIC PANEL
Anion gap: 7 (ref 5–15)
BUN: 87 mg/dL — ABNORMAL HIGH (ref 8–23)
CO2: 18 mmol/L — AB (ref 22–32)
Calcium: 8.7 mg/dL — ABNORMAL LOW (ref 8.9–10.3)
Chloride: 115 mmol/L — ABNORMAL HIGH (ref 98–111)
Creatinine, Ser: 1.67 mg/dL — ABNORMAL HIGH (ref 0.61–1.24)
GFR calc Af Amer: 49 mL/min — ABNORMAL LOW (ref >60)
GFR calc non Af Amer: 42 mL/min — ABNORMAL LOW (ref >60)
Glucose, Bld: 211 mg/dL — ABNORMAL HIGH (ref 70–99)
Potassium: 4 mmol/L (ref 3.5–5.1)
Sodium: 140 mmol/L (ref 135–145)

## 2018-10-28 NOTE — Progress Notes (Signed)
Pulmonary Critical Care Medicine [facility] [place]   PULMONARY CRITICAL CARE SERVICE  PROGRESS NOTE  Date of Service: 10/28/2018  Darryl Vargas  [id number]  DOB: [birth year]   DOA: 10/19/2018  Referring Physician: Carron Curie, MD  HPI: Darryl Vargas is a 68 y.o. male seen for follow up of Acute on Chronic Respiratory Failure.  Patient is weaning doing fairly well has been on T collar good results so far  Medications: Reviewed on Rounds  Physical Exam:  Vitals: Temperature 98.1 pulse 72 respiratory rate 26 blood pressure 181/67 saturations 98%  Ventilator Settings off the ventilator right now on T collar FiO2 is 28%  . General: Comfortable at this time . Eyes: Grossly normal lids, irises & conjunctiva . ENT: grossly tongue is normal . Neck: no obvious mass . Cardiovascular: S1 S2 normal no gallop . Respiratory: No rhonchi or rales are noted at this time . Abdomen: soft . Skin: no rash seen on limited exam . Musculoskeletal: not rigid . Psychiatric:unable to assess . Neurologic: no seizure no involuntary movements         Lab Data:   Basic Metabolic Panel: Recent Labs  Lab 10/24/18 0521 10/25/18 0557 10/26/18 0434 10/27/18 0614 10/27/18 1646 10/28/18 0624  NA 149* 150* 151* 148* 145 140  K 4.3 4.2  --  4.1 4.0 4.0  CL 120* 122*  --  120* 119* 115*  CO2 15* 19*  --  19* 18* 18*  GLUCOSE 185* 184*  --  180* 183* 211*  BUN 172* 159*  --  116* 103* 87*  CREATININE 3.76* 3.03*  --  2.17* 1.94* 1.67*  CALCIUM 8.4* 8.7*  --  9.0 9.0 8.7*    ABG: Recent Labs  Lab 10/21/18 2126 10/22/18 0410  PHART 7.333* 7.295*  PCO2ART 23.5* 24.9*  PO2ART 106 83.6  HCO3 12.1* 11.8*  O2SAT 97.8 95.7    Liver Function Tests: No results for input(s): AST, ALT, ALKPHOS, BILITOT, PROT, ALBUMIN in the last 168 hours. No results for input(s): LIPASE, AMYLASE in the last 168 hours. No results for input(s): AMMONIA in the last 168 hours.  CBC: Recent Labs   Lab 10/22/18 0651  WBC 14.9*  HGB 11.2*  HCT 36.5*  MCV 99.2  PLT 268    Cardiac Enzymes: No results for input(s): CKTOTAL, CKMB, CKMBINDEX, TROPONINI in the last 168 hours.  BNP (last 3 results) No results for input(s): BNP in the last 8760 hours.  ProBNP (last 3 results) No results for input(s): PROBNP in the last 8760 hours.  Radiological Exams: US Renal  Result Date: 10/27/2018 CLINICAL DATA:  Acute renal failure EXAM: RENAL / URINARY TRACT ULTRASOUND COMPLETE COMPARISON:  None. FINDINGS: Right Kidney: Renal measurements: 12.1 x 5.9 x 5.8 cm. = volume: 216 mL. 1.4 cm cyst is noted within the right kidney arising from the lower pole. Left Kidney: Renal measurements: 12.5 x 6.6 x 7.6 cm. = volume: 307 mL. 1.7 cm cyst is noted arising in the lower pole of the left kidney. Bladder: Decompressed IMPRESSION: Bilateral renal cysts. No evidence of hydronephrosis. Electronically Signed   By: Alcide Clever M.D.   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 Acute on chronic respiratory failure with hypoxia  we will continue with T collar continue secretion management pulmonary toilet. 2. Nontraumatic intracerebral hemorrhage at baseline remains nonverbal 3. ATN labs are improving nephrology is following the patient. 4. DVT treated 5. Anoxic encephalopathy unchanged we will continue with supportive care   I have personally seen and evaluated the patient, evaluated laboratory and imaging results, formulated the assessment and plan and placed orders. The Patient requires high complexity decision making for assessment and support.  Case was discussed on Rounds with the Respiratory Therapy Staff  Yevonne Pax, MD [facility] Pulmonary  Critical Care Medicine Sleep Medicine

## 2018-10-29 DIAGNOSIS — G931 Anoxic brain damage, not elsewhere classified: Secondary | ICD-10-CM | POA: Diagnosis not present

## 2018-10-29 DIAGNOSIS — I824Z3 Acute embolism and thrombosis of unspecified deep veins of distal lower extremity, bilateral: Secondary | ICD-10-CM | POA: Diagnosis not present

## 2018-10-29 DIAGNOSIS — J9621 Acute and chronic respiratory failure with hypoxia: Secondary | ICD-10-CM | POA: Diagnosis not present

## 2018-10-29 DIAGNOSIS — N170 Acute kidney failure with tubular necrosis: Secondary | ICD-10-CM | POA: Diagnosis not present

## 2018-10-29 LAB — BASIC METABOLIC PANEL
Anion gap: 9 (ref 5–15)
BUN: 73 mg/dL — AB (ref 8–23)
CO2: 19 mmol/L — ABNORMAL LOW (ref 22–32)
CREATININE: 1.4 mg/dL — AB (ref 0.61–1.24)
Calcium: 8.9 mg/dL (ref 8.9–10.3)
Chloride: 112 mmol/L — ABNORMAL HIGH (ref 98–111)
GFR calc Af Amer: >60 mL/min (ref >60)
GFR calc non Af Amer: 52 mL/min — ABNORMAL LOW (ref >60)
Glucose, Bld: 159 mg/dL — ABNORMAL HIGH (ref 70–99)
Potassium: 4.3 mmol/L (ref 3.5–5.1)
Sodium: 140 mmol/L (ref 135–145)

## 2018-10-29 NOTE — Progress Notes (Signed)
Pulmonary Critical [facility] [place]   PULMONARY CRITICAL CARE SERVICE  PROGRESS NOTE  Date of Service: 10/29/2018  Darryl Vargas  [id number]  DOB: 09-20-1951   DOA: 10/19/2018  Referring Physician: Carron CurieAli Hijazi, MD  HPI: Darryl Vargas is a 68 y.o. male seen for follow up of Acute on Chronic Respiratory Failure.  Patient right now is on T collar did 12 hours yesterday today the goal is for 16 hours so far looks good  Medications: Reviewed on Rounds  Physical Exam:  Vitals: Temperature 98.4 pulse 67 respiratory rate 22 blood pressure is 138/71 saturations 98%  Ventilator Settings off the ventilator on T collar wean at this time  . General: Comfortable at this time . Eyes: Grossly normal lids, irises & conjunctiva . ENT: grossly tongue is normal . Neck: no obvious mass . Cardiovascular: S1 S2 normal no gallop . Respiratory: No rhonchi no rales are noted at this time . Abdomen: soft . Skin: no rash seen on limited exam . Musculoskeletal: not rigid . Psychiatric:unable to assess . Neurologic: no seizure no involuntary movements         Lab Data:   Basic Metabolic Panel: Recent Labs  Lab 10/25/18 0557 10/26/18 0434 10/27/18 0614 10/27/18 1646 10/28/18 0624 10/29/18 0550  NA 150* 151* 148* 145 140 140  K 4.2  --  4.1 4.0 4.0 4.3  CL 122*  --  120* 119* 115* 112*  CO2 19*  --  19* 18* 18* 19*  GLUCOSE 184*  --  180* 183* 211* 159*  BUN 159*  --  116* 103* 87* 73*  CREATININE 3.03*  --  2.17* 1.94* 1.67* 1.40*  CALCIUM 8.7*  --  9.0 9.0 8.7* 8.9    ABG: No results for input(s): PHART, PCO2ART, PO2ART, HCO3, O2SAT in the last 168 hours.  Liver Function Tests: No results for input(s): AST, ALT, ALKPHOS, BILITOT, PROT, ALBUMIN in the last 168 hours. No results for input(s): LIPASE, AMYLASE in the last 168 hours. No results for input(s): AMMONIA in the last 168 hours.  CBC: No results for input(s): WBC, NEUTROABS, HGB, HCT, MCV,  PLT in the last 168 hours.  Cardiac Enzymes: No results for input(s): CKTOTAL, CKMB, CKMBINDEX, TROPONINI in the last 168 hours.  BNP (last 3 results) No results for input(s): BNP in the last 8760 hours.  ProBNP (last 3 results) No results for input(s): PROBNP in the last 8760 hours.  Radiological Exams: [place] Renal  Result Date: 10/27/2018 CLINICAL DATA:  Acute renal failure EXAM: RENAL / URINARY TRACT ULTRASOUND COMPLETE COMPARISON:  None. FINDINGS: Right Kidney: Renal measurements: 12.1 x 5.9 x 5.8 cm. = volume: 216 mL. 1.4 cm cyst is noted within the right kidney arising from the lower pole. Left Kidney: Renal measurements: 12.5 x 6.6 x 7.6 cm. = volume: 307 mL. 1.7 cm cyst is noted arising in the lower pole of the left kidney. Bladder: Decompressed IMPRESSION: Bilateral renal cysts. No evidence of hydronephrosis. Electronically Signed   By: Alcide CleverMark  Lukens M.Darryl.   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 Acute on chronic respiratory failure with hypoxia we will continue with weaning on T collar.  Titrate oxygen down as tolerated continue pulmonary toilet  supportive care. 2. Nontraumatic intracranial hemorrhage unchanged patient is going to need therapy. 3. Acute tubular necrosis labs are improving we will continue to monitor 4. DVT treated we will continue with supportive care 5. Acute anoxic encephalopathy grossly unchanged we will continue with present therapy   I have personally seen and evaluated the patient, evaluated laboratory and imaging results, formulated the assessment and plan and placed orders. The Patient requires high complexity decision making for assessment and support.  Case was discussed on Rounds with the  Respiratory Therapy Staff  Yevonne Pax, MD [facility] Pulmonary Critical Care Medicine Sleep Medicine

## 2018-10-30 DIAGNOSIS — J9621 Acute and chronic respiratory failure with hypoxia: Secondary | ICD-10-CM | POA: Diagnosis not present

## 2018-10-30 DIAGNOSIS — G931 Anoxic brain damage, not elsewhere classified: Secondary | ICD-10-CM | POA: Diagnosis not present

## 2018-10-30 DIAGNOSIS — N170 Acute kidney failure with tubular necrosis: Secondary | ICD-10-CM | POA: Diagnosis not present

## 2018-10-30 DIAGNOSIS — I824Z3 Acute embolism and thrombosis of unspecified deep veins of distal lower extremity, bilateral: Secondary | ICD-10-CM | POA: Diagnosis not present

## 2018-10-30 NOTE — Progress Notes (Signed)
[facility]  ROUNDING NOTE   Subjective:  Acute renal failure as well as hyponatremia significantly improved over the weekend. Resting comfortably in bed at the moment.    Objective:  Vital signs in last 24 hours:  Temperature 97.3 pulse 70 respirations 18 blood pressure 161/86  Physical Exam: General: Critically ill appearing  Head: Normocephalic, atraumatic. Moist oral mucosal membranes  Eyes: Anicteric  Neck: Supple, trachea midline  Lungs:  Clear to auscultation, normal effort  Heart: S1S2 no rubs  Abdomen:  Soft, nontender, bowel sounds present  Extremities: Trace peripheral edema.  Neurologic: Not following commands  Skin: No lesions       Basic Metabolic Panel: Recent Labs  Lab 10/25/18 0557 10/26/18 0434 10/27/18 0614 10/27/18 1646 10/28/18 0624 10/29/18 0550  NA 150* 151* 148* 145 140 140  K 4.2  --  4.1 4.0 4.0 4.3  CL 122*  --  120* 119* 115* 112*  CO2 19*  --  19* 18* 18* 19*  GLUCOSE 184*  --  180* 183* 211* 159*  BUN 159*  --  116* 103* 87* 73*  CREATININE 3.03*  --  2.17* 1.94* 1.67* 1.40*  CALCIUM 8.7*  --  9.0 9.0 8.7* 8.9    Liver Function Tests: No results for input(s): AST, ALT, ALKPHOS, BILITOT, PROT, ALBUMIN in the last 168 hours. No results for input(s): LIPASE, AMYLASE in the last 168 hours. No results for input(s): AMMONIA in the last 168 hours.  CBC: No results for input(s): WBC, NEUTROABS, HGB, HCT, MCV, PLT in the last 168 hours.  Cardiac Enzymes: No results for input(s): CKTOTAL, CKMB, CKMBINDEX, TROPONINI in the last 168 hours.  BNP: Invalid input(s): POCBNP  CBG: No results for input(s): GLUCAP in the last 168 hours.  Microbiology: Results for orders placed or performed during the hospital encounter of 10/19/18  Culture, Urine     Status: Abnormal   Collection Time: 10/19/18  3:11 PM  Result Value Ref Range Status   Specimen Description URINE, RANDOM  Final   Special Requests   Final    NONE Performed  at [facility], [address] [place], [place] [address]    Culture >=100,000 COLONIES/mL ENTEROBACTER AEROGENES (A)  Final   Report Status 10/21/2018 FINAL  Final   Organism ID, Bacteria ENTEROBACTER AEROGENES (A)  Final      Susceptibility   Enterobacter aerogenes - MIC*    CEFAZOLIN >=64 RESISTANT Resistant     CEFTRIAXONE <=1 SENSITIVE Sensitive     CIPROFLOXACIN <=0.25 SENSITIVE Sensitive     GENTAMICIN <=1 SENSITIVE Sensitive     IMIPENEM 2 SENSITIVE Sensitive     NITROFURANTOIN 128 RESISTANT Resistant     TRIMETH/SULFA <=20 SENSITIVE Sensitive     PIP/TAZO <=4 SENSITIVE Sensitive     * >=100,000 COLONIES/mL ENTEROBACTER AEROGENES  Culture, respiratory (non-expectorated)     Status: None   Collection Time: 10/24/18  2:43 PM  Result Value Ref Range Status   Specimen Description TRACHEAL ASPIRATE  Final   Special Requests NONE  Final   Gram Stain   Final    FEW WBC PRESENT, PREDOMINANTLY PMN RARE GRAM POSITIVE RODS    Culture   Final    FEW Consistent with normal respiratory flora. Performed at [facility], [address] [place], [place] [address]    Report Status 10/27/2018 FINAL  Final    Coagulation Studies: No results for input(s): LABPROT, INR in the last 72 hours.  Urinalysis:  No results for input(s): COLORURINE, LABSPEC, PHURINE, GLUCOSEU, HGBUR, BILIRUBINUR, KETONESUR, PROTEINUR, UROBILINOGEN, NITRITE, LEUKOCYTESUR in the last 72 hours.  Invalid input(s): APPERANCEUR    Imaging: No results found.   Medications:     iopamidol  Assessment/ Plan:  68 y.o. male with a PMHx of CVA with intracranial hemorrhage s/p EVD, hypertension, encephalopathy, hypernatremia, acute respiratory failure, acute renal failure, hypernatremia who was admitted to [facility] on 10/19/2018 for ongoing care.  1.  Acute renal failure, nonoliguric. 2.  Hypernatremia.  3.  Metabolic acidosis.  4.  Hypertension.   Plan:  The patient was hydrated over  the weekend.  BUN currently down to 73 with a creatinine of 1.4.  Serum sodium normalized to 140.  Now off of D5W.  Still has some mild metabolic acidosis but overall improved as compared to last week.  Continue supportive care for now and monitor BUN and creatinine trend.  Further plan as patient progresses.   LOS: 0 Darryl Vargas 1/6/20208:45 AM

## 2018-10-30 NOTE — Progress Notes (Signed)
Pulmonary Critical Care Medicine [facility] [place]   PULMONARY CRITICAL CARE SERVICE  PROGRESS NOTE  Date of Service: 10/30/2018  Darryl Vargas  LFY:[id number]  DOB: 1951-03-28   DOA: 10/19/2018  Referring Physician: Carron Curie, MD  HPI: Darryl Vargas is a 68 y.o. male seen for follow up of Acute on Chronic Respiratory Failure.  Continues to wean on T collar the goal is for 20 hours  Medications: Reviewed on Rounds  Physical Exam:  Vitals: Temperature 97.3 pulse 70 respiratory 18 blood pressure 161/86 saturation 99%  Ventilator Settings off the ventilator on T collar right now  . General: Comfortable at this time . Eyes: Grossly normal lids, irises & conjunctiva . ENT: grossly tongue is normal . Neck: no obvious mass . Cardiovascular: S1 S2 normal no gallop . Respiratory: Coarse breath sounds no rhonchi . Abdomen: soft . Skin: no rash seen on limited exam . Musculoskeletal: not rigid . Psychiatric:unable to assess . Neurologic: no seizure no involuntary movements         Lab Data:   Basic Metabolic Panel: Recent Labs  Lab 10/25/18 0557 10/26/18 0434 10/27/18 0614 10/27/18 1646 10/28/18 0624 10/29/18 0550  NA 150* 151* 148* 145 140 140  K 4.2  --  4.1 4.0 4.0 4.3  CL 122*  --  120* 119* 115* 112*  CO2 19*  --  19* 18* 18* 19*  GLUCOSE 184*  --  180* 183* 211* 159*  BUN 159*  --  116* 103* 87* 73*  CREATININE 3.03*  --  2.17* 1.94* 1.67* 1.40*  CALCIUM 8.7*  --  9.0 9.0 8.7* 8.9    ABG: No results for input(s): PHART, PCO2ART, PO2ART, HCO3, O2SAT in the last 168 hours.  Liver Function Tests: No results for input(s): AST, ALT, ALKPHOS, BILITOT, PROT, ALBUMIN in the last 168 hours. No results for input(s): LIPASE, AMYLASE in the last 168 hours. No results for input(s): AMMONIA in the last 168 hours.  CBC: No results for input(s): WBC, NEUTROABS, HGB, HCT, MCV, PLT in the last 168 hours.  Cardiac Enzymes: No results for input(s):  CKTOTAL, CKMB, CKMBINDEX, TROPONINI in the last 168 hours.  BNP (last 3 results) No results for input(s): BNP in the last 8760 hours.  ProBNP (last 3 results) No results for input(s): PROBNP in the last 8760 hours.  Radiological Exams: No results found.  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 Acute on chronic respiratory failure with hypoxia we will continue with T collar the goal is 20 hours continue to advance as tolerated 2. Nontraumatic intracerebral hemorrhage continue with supportive care 3. Acute tubular necrosis resolved 4. DVT treated 5. Anoxic encephalopathy unchanged   I have personally seen and evaluated the patient, evaluated laboratory and imaging results, formulated the assessment and plan and placed orders. The Patient requires high complexity decision making for assessment and support.  Case was discussed on Rounds with the Respiratory Therapy Staff  Yevonne Pax, MD [facility] Pulmonary Critical Care Medicine Sleep Medicine

## 2018-10-31 DIAGNOSIS — I824Z3 Acute embolism and thrombosis of unspecified deep veins of distal lower extremity, bilateral: Secondary | ICD-10-CM | POA: Diagnosis not present

## 2018-10-31 DIAGNOSIS — N170 Acute kidney failure with tubular necrosis: Secondary | ICD-10-CM | POA: Diagnosis not present

## 2018-10-31 DIAGNOSIS — J9621 Acute and chronic respiratory failure with hypoxia: Secondary | ICD-10-CM | POA: Diagnosis not present

## 2018-10-31 DIAGNOSIS — G931 Anoxic brain damage, not elsewhere classified: Secondary | ICD-10-CM | POA: Diagnosis not present

## 2018-10-31 NOTE — Progress Notes (Signed)
Pulmonary Critical Care Medicine [facility] [place]   PULMONARY CRITICAL CARE SERVICE  PROGRESS NOTE  Date of Service: 10/31/2018  Darryl Vargas  [id number]  DOB: Feb 25, 1951   DOA: 10/19/2018  Referring Physician: Carron Curie, MD  HPI: Darryl Vargas is a 68 y.o. male seen for follow up of Acute on Chronic Respiratory Failure.  Patient is on T collar the goal is for 24 hours doing well  Medications: Reviewed on Rounds  Physical Exam:  Vitals: Temperature 98.7 pulse 80 respiratory rate 30 blood pressure 156/78 saturations 97%  Ventilator Settings mode of ventilation is off the ventilator on T collar FiO2 28%  . General: Comfortable at this time . Eyes: Grossly normal lids, irises & conjunctiva . ENT: grossly tongue is normal . Neck: no obvious mass . Cardiovascular: S1 S2 normal no gallop . Respiratory: Coarse breath sounds no rhonchi are noted . Abdomen: soft . Skin: no rash seen on limited exam . Musculoskeletal: not rigid . Psychiatric:unable to assess . Neurologic: no seizure no involuntary movements         Lab Data:   Basic Metabolic Panel: Recent Labs  Lab 10/25/18 0557 10/26/18 0434 10/27/18 0614 10/27/18 1646 10/28/18 0624 10/29/18 0550  NA 150* 151* 148* 145 140 140  K 4.2  --  4.1 4.0 4.0 4.3  CL 122*  --  120* 119* 115* 112*  CO2 19*  --  19* 18* 18* 19*  GLUCOSE 184*  --  180* 183* 211* 159*  BUN 159*  --  116* 103* 87* 73*  CREATININE 3.03*  --  2.17* 1.94* 1.67* 1.40*  CALCIUM 8.7*  --  9.0 9.0 8.7* 8.9    ABG: No results for input(s): PHART, PCO2ART, PO2ART, HCO3, O2SAT in the last 168 hours.  Liver Function Tests: No results for input(s): AST, ALT, ALKPHOS, BILITOT, PROT, ALBUMIN in the last 168 hours. No results for input(s): LIPASE, AMYLASE in the last 168 hours. No results for input(s): AMMONIA in the last 168 hours.  CBC: No results for input(s): WBC, NEUTROABS, HGB, HCT, MCV, PLT in the last 168  hours.  Cardiac Enzymes: No results for input(s): CKTOTAL, CKMB, CKMBINDEX, TROPONINI in the last 168 hours.  BNP (last 3 results) No results for input(s): BNP in the last 8760 hours.  ProBNP (last 3 results) No results for input(s): PROBNP in the last 8760 hours.  Radiological Exams: No results found.  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 Acute on chronic respiratory failure with hypoxia we will continue with T collar weaning as tolerated continue pulmonary toilet supportive care. 2. Nontraumatic intracranial hemorrhage at baseline 3. Acute tubular necrosis improved followed by nephrology 4. DVT treated we will continue supportive care 5. Acute anoxic encephalopathy grossly unchanged   I have personally seen and evaluated the patient, evaluated laboratory and imaging results, formulated the assessment and plan and placed orders. The Patient requires high complexity decision making for assessment and support.  Case was discussed on Rounds with the Respiratory Therapy Staff  Yevonne Pax, MD [facility] Pulmonary Critical Care Medicine Sleep Medicine

## 2018-11-01 ENCOUNTER — Other Ambulatory Visit (HOSPITAL_COMMUNITY): Payer: Medicare Other

## 2018-11-01 DIAGNOSIS — G931 Anoxic brain damage, not elsewhere classified: Secondary | ICD-10-CM | POA: Diagnosis not present

## 2018-11-01 DIAGNOSIS — I824Z3 Acute embolism and thrombosis of unspecified deep veins of distal lower extremity, bilateral: Secondary | ICD-10-CM | POA: Diagnosis not present

## 2018-11-01 DIAGNOSIS — J9621 Acute and chronic respiratory failure with hypoxia: Secondary | ICD-10-CM | POA: Diagnosis not present

## 2018-11-01 DIAGNOSIS — N170 Acute kidney failure with tubular necrosis: Secondary | ICD-10-CM | POA: Diagnosis not present

## 2018-11-01 NOTE — Progress Notes (Signed)
Pulmonary Critical Care Medicine [facility] [place]   PULMONARY CRITICAL CARE SERVICE  PROGRESS NOTE  Date of Service: 11/01/2018  Darryl Vargas [place]  WUJ:[id number]  DOB: 15-Jun-1951   DOA: 10/19/2018  Referring Physician: Carron CurieAli Hijazi, MD  HPI: Darryl Vargas [place] is a 68 y.o. male seen for follow up of Acute on Chronic Respiratory Failure.  Patient is on T collar has been off the ventilator will be reaching 48-hour goal.  Right now is doing well.  Medications: Reviewed on Rounds  Physical Exam:  Vitals: Temperature 99.1 pulse 85 respiratory 28 blood pressure 171/88 saturations 96%  Ventilator Settings currently is on T collar FiO2 28%  . General: Comfortable at this time . Eyes: Grossly normal lids, irises & conjunctiva . ENT: grossly tongue is normal . Neck: no obvious mass . Cardiovascular: S1 S2 normal no gallop . Respiratory: No rhonchi no rales are noted . Abdomen: soft . Skin: no rash seen on limited exam . Musculoskeletal: not rigid . Psychiatric:unable to assess . Neurologic: no seizure no involuntary movements         Lab Data:   Basic Metabolic Panel: Recent Labs  Lab 10/26/18 0434 10/27/18 0614 10/27/18 1646 10/28/18 0624 10/29/18 0550  NA 151* 148* 145 140 140  K  --  4.1 4.0 4.0 4.3  CL  --  120* 119* 115* 112*  CO2  --  19* 18* 18* 19*  GLUCOSE  --  180* 183* 211* 159*  BUN  --  116* 103* 87* 73*  CREATININE  --  2.17* 1.94* 1.67* 1.40*  CALCIUM  --  9.0 9.0 8.7* 8.9    ABG: No results for input(s): PHART, PCO2ART, PO2ART, HCO3, O2SAT in the last 168 hours.  Liver Function Tests: No results for input(s): AST, ALT, ALKPHOS, BILITOT, PROT, ALBUMIN in the last 168 hours. No results for input(s): LIPASE, AMYLASE in the last 168 hours. No results for input(s): AMMONIA in the last 168 hours.  CBC: No results for input(s): WBC, NEUTROABS, HGB, HCT, MCV, PLT in the last 168 hours.  Cardiac Enzymes: No results for input(s): CKTOTAL,  CKMB, CKMBINDEX, TROPONINI in the last 168 hours.  BNP (last 3 results) No results for input(s): BNP in the last 8760 hours.  ProBNP (last 3 results) No results for input(s): PROBNP in the last 8760 hours.  Radiological Exams: No results found.  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 Acute on chronic respiratory failure with hypoxia patient continues to wean is tolerating it well.  We will continue to advance 2. Nontraumatic intracranial hemorrhage at baseline we will continue with supportive care 3. Tubular necrosis followed by nephrology 4. DVT treated we will continue with present management 5. Anoxic encephalopathy grossly unchanged   I have personally seen and evaluated the patient, evaluated laboratory and imaging results, formulated the assessment and plan and placed orders. The Patient requires high complexity decision making for assessment and support.  Case was discussed on Rounds with the Respiratory Therapy Staff  Yevonne PaxSaadat A Sedalia Greeson, MD [facility] Pulmonary Critical Care Medicine Sleep Medicine

## 2018-11-01 NOTE — Progress Notes (Signed)
[facility]  ROUNDING NOTE   Subjective:  Patient seen at bedside. No new renal function testing available today. Patient still not following any commands however.   Objective:  Vital signs in last 24 hours:  Temperature 99.1 pulse 85 respirations 28 blood pressure 171/88  Physical Exam: General: Critically ill appearing  Head: Normocephalic, atraumatic. Moist oral mucosal membranes  Eyes: Anicteric  Neck: Supple, trachea midline  Lungs:  Clear to auscultation, normal effort  Heart: S1S2 no rubs  Abdomen:  Soft, nontender, bowel sounds present  Extremities: Trace peripheral edema.  Neurologic: Not following commands  Skin: No lesions       Basic Metabolic Panel: Recent Labs  Lab 10/26/18 0434  10/27/18 0614 10/27/18 1646 10/28/18 0624 10/29/18 0550  NA 151*  --  148* 145 140 140  K  --   --  4.1 4.0 4.0 4.3  CL  --   --  120* 119* 115* 112*  CO2  --   --  19* 18* 18* 19*  GLUCOSE  --   --  180* 183* 211* 159*  BUN  --   --  116* 103* 87* 73*  CREATININE  --   --  2.17* 1.94* 1.67* 1.40*  CALCIUM  --    < > 9.0 9.0 8.7* 8.9   < > = values in this interval not displayed.    Liver Function Tests: No results for input(s): AST, ALT, ALKPHOS, BILITOT, PROT, ALBUMIN in the last 168 hours. No results for input(s): LIPASE, AMYLASE in the last 168 hours. No results for input(s): AMMONIA in the last 168 hours.  CBC: No results for input(s): WBC, NEUTROABS, HGB, HCT, MCV, PLT in the last 168 hours.  Cardiac Enzymes: No results for input(s): CKTOTAL, CKMB, CKMBINDEX, TROPONINI in the last 168 hours.  BNP: Invalid input(s): POCBNP  CBG: No results for input(s): GLUCAP in the last 168 hours.  Microbiology: Results for orders placed or performed during the hospital encounter of 10/19/18  Culture, Urine     Status: Abnormal   Collection Time: 10/19/18  3:11 PM  Result Value Ref Range Status   Specimen Description URINE, RANDOM  Final   Special  Requests   Final    NONE Performed at [facility], [address], [place], [place] [address]    Culture >=100,000 COLONIES/mL ENTEROBACTER AEROGENES (A)  Final   Report Status 10/21/2018 FINAL  Final   Organism ID, Bacteria ENTEROBACTER AEROGENES (A)  Final      Susceptibility   Enterobacter aerogenes - MIC*    CEFAZOLIN >=64 RESISTANT Resistant     CEFTRIAXONE <=1 SENSITIVE Sensitive     CIPROFLOXACIN <=0.25 SENSITIVE Sensitive     GENTAMICIN <=1 SENSITIVE Sensitive     IMIPENEM 2 SENSITIVE Sensitive     NITROFURANTOIN 128 RESISTANT Resistant     TRIMETH/SULFA <=20 SENSITIVE Sensitive     PIP/TAZO <=4 SENSITIVE Sensitive     * >=100,000 COLONIES/mL ENTEROBACTER AEROGENES  Culture, respiratory (non-expectorated)     Status: None   Collection Time: 10/24/18  2:43 PM  Result Value Ref Range Status   Specimen Description TRACHEAL ASPIRATE  Final   Special Requests NONE  Final   Gram Stain   Final    FEW WBC PRESENT, PREDOMINANTLY PMN RARE GRAM POSITIVE RODS    Culture   Final    FEW Consistent with normal respiratory flora. Performed at [facility], [address] [place], [place] [address]  Report Status 10/27/2018 FINAL  Final    Coagulation Studies: No results for input(s): LABPROT, INR in the last 72 hours.  Urinalysis: No results for input(s): COLORURINE, LABSPEC, PHURINE, GLUCOSEU, HGBUR, BILIRUBINUR, KETONESUR, PROTEINUR, UROBILINOGEN, NITRITE, LEUKOCYTESUR in the last 72 hours.  Invalid input(s): APPERANCEUR    Imaging: No results found.   Medications:     iopamidol  Assessment/ Plan:  68 y.o. male with a PMHx of CVA with intracranial hemorrhage s/p EVD, hypertension, encephalopathy, hypernatremia, acute respiratory failure, acute renal failure, hypernatremia who was admitted to [facility] on 10/19/2018 for ongoing care.  1.  Acute renal failure, nonoliguric. 2.  Hypernatremia.  3.  Metabolic acidosis.  4.  Hypertension.    Plan:  Patient has good urine output.  Urine output was 2.4 L over the preceding 24 hours.  No new renal function testing available today.  Recommend continue to periodically check renal function as well as serum sodium.  Avoid nephrotoxins in the recovery period.  Further plan as patient progresses.  LOS: 0 Alayia Meggison 1/8/20209:29 AM

## 2018-11-02 DIAGNOSIS — J9621 Acute and chronic respiratory failure with hypoxia: Secondary | ICD-10-CM | POA: Diagnosis not present

## 2018-11-02 DIAGNOSIS — G931 Anoxic brain damage, not elsewhere classified: Secondary | ICD-10-CM | POA: Diagnosis not present

## 2018-11-02 DIAGNOSIS — N170 Acute kidney failure with tubular necrosis: Secondary | ICD-10-CM | POA: Diagnosis not present

## 2018-11-02 DIAGNOSIS — I824Z3 Acute embolism and thrombosis of unspecified deep veins of distal lower extremity, bilateral: Secondary | ICD-10-CM | POA: Diagnosis not present

## 2018-11-02 LAB — CBC
HCT: 36.7 % — ABNORMAL LOW (ref 39.0–52.0)
Hemoglobin: 11.3 g/dL — ABNORMAL LOW (ref 13.0–17.0)
MCH: 30.1 pg (ref 26.0–34.0)
MCHC: 30.8 g/dL (ref 30.0–36.0)
MCV: 97.9 fL (ref 80.0–100.0)
NRBC: 0 % (ref 0.0–0.2)
Platelets: 201 10*3/uL (ref 150–400)
RBC: 3.75 MIL/uL — ABNORMAL LOW (ref 4.22–5.81)
RDW: 15.3 % (ref 11.5–15.5)
WBC: 11.9 10*3/uL — ABNORMAL HIGH (ref 4.0–10.5)

## 2018-11-02 NOTE — Progress Notes (Signed)
Pulmonary Critical Care Medicine [facility] [place]   PULMONARY CRITICAL CARE SERVICE  PROGRESS NOTE  Date of Service: 11/02/2018  Darryl Vargas  SKA:[id number]  DOB: 01-13-1951   DOA: 10/19/2018  Referring Physician: Carron Curie, MD  HPI: Darryl Vargas is a 68 y.o. male seen for follow up of Acute on Chronic Respiratory Failure.  Patient is on T collar has been off the ventilator for more than 48 hours and is doing well.  Medications: Reviewed on Rounds  Physical Exam:  Vitals: Temperature 99.6 pulse 83 respiratory rate 32 blood pressure 140/73 saturations 96%  Ventilator Settings mode of ventilation is spontaneous off the ventilator on T collar  . General: Comfortable at this time . Eyes: Grossly normal lids, irises & conjunctiva . ENT: grossly tongue is normal . Neck: no obvious mass . Cardiovascular: S1 S2 normal no gallop . Respiratory: No rhonchi or rales are noted at this time . Abdomen: soft . Skin: no rash seen on limited exam . Musculoskeletal: not rigid . Psychiatric:unable to assess . Neurologic: no seizure no involuntary movements         Lab Data:   Basic Metabolic Panel: Recent Labs  Lab 10/27/18 0614 10/27/18 1646 10/28/18 0624 10/29/18 0550  NA 148* 145 140 140  K 4.1 4.0 4.0 4.3  CL 120* 119* 115* 112*  CO2 19* 18* 18* 19*  GLUCOSE 180* 183* 211* 159*  BUN 116* 103* 87* 73*  CREATININE 2.17* 1.94* 1.67* 1.40*  CALCIUM 9.0 9.0 8.7* 8.9    ABG: No results for input(s): PHART, PCO2ART, PO2ART, HCO3, O2SAT in the last 168 hours.  Liver Function Tests: No results for input(s): AST, ALT, ALKPHOS, BILITOT, PROT, ALBUMIN in the last 168 hours. No results for input(s): LIPASE, AMYLASE in the last 168 hours. No results for input(s): AMMONIA in the last 168 hours.  CBC: Recent Labs  Lab 11/02/18 0508  WBC 11.9*  HGB 11.3*  HCT 36.7*  MCV 97.9  PLT 201    Cardiac Enzymes: No results for input(s): CKTOTAL, CKMB,  CKMBINDEX, TROPONINI in the last 168 hours.  BNP (last 3 results) No results for input(s): BNP in the last 8760 hours.  ProBNP (last 3 results) No results for input(s): PROBNP in the last 8760 hours.  Radiological Exams: Dg Chest Port 1 View  Result Date: 11/01/2018 CLINICAL DATA:  Pneumonia EXAM: PORTABLE CHEST 1 VIEW COMPARISON:  10/19/2018 FINDINGS: Tracheostomy tube is stable. Consolidation at the left lung base is stable. Lungs are otherwise clear. Low lung volumes. No pneumothorax. Small left pleural effusion may also be present. IMPRESSION: Stable consolidation at the left lung base. Electronically Signed   By: Jolaine Click M.D.   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 Acute on chronic respiratory failure with hypoxia patient has been liberated from the ventilator.  Continue with aggressive pulmonary toilet supportive care. 2. Nontraumatic intracranial hemorrhage patient is at baseline 3. Acute tubular necrosis followed by nephrology 4. DVT treated 5. Anoxic encephalopathy remains unchanged   I have personally seen and evaluated the patient, evaluated laboratory and imaging results, formulated the assessment and plan and placed orders. The Patient requires high complexity decision making for assessment and support.  Case was discussed  on Rounds with the Respiratory Therapy Staff  Allyne Gee, MD [facility] Pulmonary Critical Care Medicine Sleep Medicine

## 2018-11-03 DIAGNOSIS — N170 Acute kidney failure with tubular necrosis: Secondary | ICD-10-CM | POA: Diagnosis not present

## 2018-11-03 DIAGNOSIS — I824Z3 Acute embolism and thrombosis of unspecified deep veins of distal lower extremity, bilateral: Secondary | ICD-10-CM | POA: Diagnosis not present

## 2018-11-03 DIAGNOSIS — J9621 Acute and chronic respiratory failure with hypoxia: Secondary | ICD-10-CM | POA: Diagnosis not present

## 2018-11-03 DIAGNOSIS — G931 Anoxic brain damage, not elsewhere classified: Secondary | ICD-10-CM | POA: Diagnosis not present

## 2018-11-03 LAB — CULTURE, RESPIRATORY W GRAM STAIN

## 2018-11-03 NOTE — Progress Notes (Signed)
Pulmonary Critical Care Medicine [facility] [place]   PULMONARY CRITICAL CARE SERVICE  PROGRESS NOTE  Date of Service: 11/03/2018  Darryl Vargas  [id number]  DOB: [birth year]   DOA: 10/19/2018  Referring Physician: Carron Curie, MD  HPI: Darryl Vargas is a 68 y.o. male seen for follow up of Acute on Chronic Respiratory Failure.  Currently is on T collar without distress.  Has been on 28% FiO2 still has moderate amount of secretions  Medications: Reviewed on Rounds  Physical Exam:  Vitals: Temperature is 98.4 pulse 77 respiratory rate 22 blood pressure 133/79 saturations 97%.  Ventilator Settings off the ventilator right now on T collar requiring 28% FiO2  . General: Comfortable at this time . Eyes: Grossly normal lids, irises & conjunctiva . ENT: grossly tongue is normal . Neck: no obvious mass . Cardiovascular: S1 S2 normal no gallop . Respiratory: No rhonchi or rales are noted at this time . Abdomen: soft . Skin: no rash seen on limited exam . Musculoskeletal: not rigid . Psychiatric:unable to assess . Neurologic: no seizure no involuntary movements         Lab Data:   Basic Metabolic Panel: Recent Labs  Lab 10/27/18 1646 10/28/18 0624 10/29/18 0550  NA 145 140 140  K 4.0 4.0 4.3  CL 119* 115* 112*  CO2 18* 18* 19*  GLUCOSE 183* 211* 159*  BUN 103* 87* 73*  CREATININE 1.94* 1.67* 1.40*  CALCIUM 9.0 8.7* 8.9    ABG: No results for input(s): PHART, PCO2ART, PO2ART, HCO3, O2SAT in the last 168 hours.  Liver Function Tests: No results for input(s): AST, ALT, ALKPHOS, BILITOT, PROT, ALBUMIN in the last 168 hours. No results for input(s): LIPASE, AMYLASE in the last 168 hours. No results for input(s): AMMONIA in the last 168 hours.  CBC: Recent Labs  Lab 11/02/18 0508  WBC 11.9*  HGB 11.3*  HCT 36.7*  MCV 97.9  PLT 201    Cardiac Enzymes: No results for input(s): CKTOTAL, CKMB, CKMBINDEX, TROPONINI in the last 168 hours.  BNP  (last 3 results) No results for input(s): BNP in the last 8760 hours.  ProBNP (last 3 results) No results for input(s): PROBNP in the last 8760 hours.  Radiological Exams: Dg Chest Port 1 View  Result Date: 11/01/2018 CLINICAL DATA:  Pneumonia EXAM: PORTABLE CHEST 1 VIEW COMPARISON:  10/19/2018 FINDINGS: Tracheostomy tube is stable. Consolidation at the left lung base is stable. Lungs are otherwise clear. Low lung volumes. No pneumothorax. Small left pleural effusion may also be present. IMPRESSION: Stable consolidation at the left lung base. Electronically Signed   By: Jolaine Click M.D.   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 Acute on chronic respiratory failure with hypoxia we will continue with the T collar continue aggressive pulmonary toilet patient has moderate secretions as noted above. 2. Nontraumatic intracerebral hemorrhage of the basal ganglia grossly unchanged we will continue with supportive care 3. Acute tubular necrosis at baseline labs improving 4. DVT treated 5. Anoxic encephalopathy patient is at baseline we will continue with supportive care   I have personally seen and evaluated the patient, evaluated laboratory and imaging results, formulated the assessment and plan and placed orders. The Patient requires high  complexity decision making for assessment and support.  Case was discussed on Rounds with the Respiratory Therapy Staff  Allyne Gee, MD [facility] Pulmonary Critical Care Medicine Sleep Medicine

## 2018-11-04 DIAGNOSIS — N170 Acute kidney failure with tubular necrosis: Secondary | ICD-10-CM | POA: Diagnosis not present

## 2018-11-04 DIAGNOSIS — J9621 Acute and chronic respiratory failure with hypoxia: Secondary | ICD-10-CM | POA: Diagnosis not present

## 2018-11-04 DIAGNOSIS — G931 Anoxic brain damage, not elsewhere classified: Secondary | ICD-10-CM | POA: Diagnosis not present

## 2018-11-04 DIAGNOSIS — I824Z3 Acute embolism and thrombosis of unspecified deep veins of distal lower extremity, bilateral: Secondary | ICD-10-CM | POA: Diagnosis not present

## 2018-11-04 NOTE — Progress Notes (Signed)
Pulmonary Critical Care Medicine [facility] [place]   PULMONARY CRITICAL CARE SERVICE  PROGRESS NOTE  Date of Service: 11/04/2018  Darryl Vargas  KLK:[id number]  DOB: Sep 21, 1951   DOA: 10/19/2018  Referring Physician: Carron Curie, MD  HPI: Darryl Vargas is a 68 y.o. male seen for follow up of Acute on Chronic Respiratory Failure.  Patient is on T collar at this time is at baseline secretions are fair to moderate currently is on 28% FiO2  Medications: Reviewed on Rounds  Physical Exam:  Vitals: Temperature 98.8 pulse 67 respiratory rate 23 blood pressure 143/73 saturations 98%  Ventilator Settings off the ventilator on T collar right now  . General: Comfortable at this time . Eyes: Grossly normal lids, irises & conjunctiva . ENT: grossly tongue is normal . Neck: no obvious mass . Cardiovascular: S1 S2 normal no gallop . Respiratory: No rhonchi or rales are noted at this time . Abdomen: soft . Skin: no rash seen on limited exam . Musculoskeletal: not rigid . Psychiatric:unable to assess . Neurologic: no seizure no involuntary movements         Lab Data:   Basic Metabolic Panel: Recent Labs  Lab 10/29/18 0550  NA 140  K 4.3  CL 112*  CO2 19*  GLUCOSE 159*  BUN 73*  CREATININE 1.40*  CALCIUM 8.9    ABG: No results for input(s): PHART, PCO2ART, PO2ART, HCO3, O2SAT in the last 168 hours.  Liver Function Tests: No results for input(s): AST, ALT, ALKPHOS, BILITOT, PROT, ALBUMIN in the last 168 hours. No results for input(s): LIPASE, AMYLASE in the last 168 hours. No results for input(s): AMMONIA in the last 168 hours.  CBC: Recent Labs  Lab 11/02/18 0508  WBC 11.9*  HGB 11.3*  HCT 36.7*  MCV 97.9  PLT 201    Cardiac Enzymes: No results for input(s): CKTOTAL, CKMB, CKMBINDEX, TROPONINI in the last 168 hours.  BNP (last 3 results) No results for input(s): BNP in the last 8760 hours.  ProBNP (last 3 results) No results for input(s):  PROBNP in the last 8760 hours.  Radiological Exams: No results found.  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 Acute on chronic respiratory failure with hypoxia we will continue with T collar trials continue pulmonary toilet supportive care. 2. Acute tubular necrosis followed by nephrology creatinine was improving 3. DVT treated 4. Nontraumatic intracranial hemorrhage at baseline continue supportive care 5. Anoxic encephalopathy grossly unchanged   I have personally seen and evaluated the patient, evaluated laboratory and imaging results, formulated the assessment and plan and placed orders. The Patient requires high complexity decision making for assessment and support.  Case was discussed on Rounds with the Respiratory Therapy Staff  Yevonne Pax, MD [facility] Pulmonary Critical Care Medicine Sleep Medicine

## 2018-11-05 DIAGNOSIS — N170 Acute kidney failure with tubular necrosis: Secondary | ICD-10-CM | POA: Diagnosis not present

## 2018-11-05 DIAGNOSIS — G931 Anoxic brain damage, not elsewhere classified: Secondary | ICD-10-CM | POA: Diagnosis not present

## 2018-11-05 DIAGNOSIS — I824Z3 Acute embolism and thrombosis of unspecified deep veins of distal lower extremity, bilateral: Secondary | ICD-10-CM | POA: Diagnosis not present

## 2018-11-05 DIAGNOSIS — J9621 Acute and chronic respiratory failure with hypoxia: Secondary | ICD-10-CM | POA: Diagnosis not present

## 2018-11-05 NOTE — Progress Notes (Signed)
Pulmonary Critical Care Medicine [facility] [place]   PULMONARY CRITICAL CARE SERVICE  PROGRESS NOTE  Date of Service: 11/05/2018  Darryl Vargas  GMW:[id number]  DOB: 12/18/1950   DOA: 10/19/2018  Referring Physician: Carron Curie, MD  HPI: Darryl Vargas is a 68 y.o. male seen for follow up of Acute on Chronic Respiratory Failure.  Patient right now is comfortable without distress.  The patient has been on T collar secretions are copious therefore limited in terms of being able to decannulate  Medications: Reviewed on Rounds  Physical Exam:  Vitals: Temperature 98.7 pulse 8078 respiratory rate 23 blood pressure 125/68 saturations 97%  Ventilator Settings off the ventilator on T collar right now  . General: Comfortable at this time . Eyes: Grossly normal lids, irises & conjunctiva . ENT: grossly tongue is normal . Neck: no obvious mass . Cardiovascular: S1 S2 normal no gallop . Respiratory: Coarse breath sounds with few rhonchi . Abdomen: soft . Skin: no rash seen on limited exam . Musculoskeletal: not rigid . Psychiatric:unable to assess . Neurologic: no seizure no involuntary movements         Lab Data:   Basic Metabolic Panel: No results for input(s): NA, K, CL, CO2, GLUCOSE, BUN, CREATININE, CALCIUM, MG, PHOS in the last 168 hours.  ABG: No results for input(s): PHART, PCO2ART, PO2ART, HCO3, O2SAT in the last 168 hours.  Liver Function Tests: No results for input(s): AST, ALT, ALKPHOS, BILITOT, PROT, ALBUMIN in the last 168 hours. No results for input(s): LIPASE, AMYLASE in the last 168 hours. No results for input(s): AMMONIA in the last 168 hours.  CBC: Recent Labs  Lab 11/02/18 0508  WBC 11.9*  HGB 11.3*  HCT 36.7*  MCV 97.9  PLT 201    Cardiac Enzymes: No results for input(s): CKTOTAL, CKMB, CKMBINDEX, TROPONINI in the last 168 hours.  BNP (last 3 results) No results for input(s): BNP in the last 8760 hours.  ProBNP (last 3  results) No results for input(s): PROBNP in the last 8760 hours.  Radiological Exams: No results found.  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 Acute on chronic respiratory failure with hypoxia we will continue with T collar trials continue pulmonary toilet supportive care. 2. Intracranial hemorrhage at baseline no improvement noted 3. ATN resolved 4. DVT treated we will continue supportive care 5. Anoxic encephalopathy grossly remains unchanged   I have personally seen and evaluated the patient, evaluated laboratory and imaging results, formulated the assessment and plan and placed orders. The Patient requires high complexity decision making for assessment and support.  Case was discussed on Rounds with the Respiratory Therapy Staff  Yevonne Pax, MD [facility] Pulmonary Critical Care Medicine Sleep Medicine

## 2018-11-06 DIAGNOSIS — G931 Anoxic brain damage, not elsewhere classified: Secondary | ICD-10-CM | POA: Diagnosis not present

## 2018-11-06 DIAGNOSIS — I824Z3 Acute embolism and thrombosis of unspecified deep veins of distal lower extremity, bilateral: Secondary | ICD-10-CM | POA: Diagnosis not present

## 2018-11-06 DIAGNOSIS — J9621 Acute and chronic respiratory failure with hypoxia: Secondary | ICD-10-CM | POA: Diagnosis not present

## 2018-11-06 DIAGNOSIS — N170 Acute kidney failure with tubular necrosis: Secondary | ICD-10-CM | POA: Diagnosis not present

## 2018-11-06 NOTE — Progress Notes (Signed)
[facility]  ROUNDING NOTE   Subjective:  Patient still having good urine output. Sitting up in chair this a.m. No new creatinine today.   Objective:  Vital signs in last 24 hours:  Temperature 97.6 pulse 79 respirations 28 blood pressure 128/66  Physical Exam: General: Critically ill appearing  Head: Normocephalic, atraumatic. Moist oral mucosal membranes  Eyes: Anicteric  Neck: Supple, trachea midline  Lungs:  Clear to auscultation, normal effort  Heart: S1S2 no rubs  Abdomen:  Soft, nontender, bowel sounds present  Extremities: Trace peripheral edema.  Neurologic: Not following commands  Skin: No lesions       Basic Metabolic Panel: No results for input(s): NA, K, CL, CO2, GLUCOSE, BUN, CREATININE, CALCIUM, MG, PHOS in the last 168 hours.  Liver Function Tests: No results for input(s): AST, ALT, ALKPHOS, BILITOT, PROT, ALBUMIN in the last 168 hours. No results for input(s): LIPASE, AMYLASE in the last 168 hours. No results for input(s): AMMONIA in the last 168 hours.  CBC: Recent Labs  Lab 11/02/18 0508  WBC 11.9*  HGB 11.3*  HCT 36.7*  MCV 97.9  PLT 201    Cardiac Enzymes: No results for input(s): CKTOTAL, CKMB, CKMBINDEX, TROPONINI in the last 168 hours.  BNP: Invalid input(s): POCBNP  CBG: No results for input(s): GLUCAP in the last 168 hours.  Microbiology: Results for orders placed or performed during the hospital encounter of 10/19/18  Culture, Urine     Status: Abnormal   Collection Time: 10/19/18  3:11 PM  Result Value Ref Range Status   Specimen Description URINE, RANDOM  Final   Special Requests   Final    NONE Performed at [facility], [address], [place], [place] [address]    Culture >=100,000 COLONIES/mL ENTEROBACTER AEROGENES (A)  Final   Report Status 10/21/2018 FINAL  Final   Organism ID, Bacteria ENTEROBACTER AEROGENES (A)  Final      Susceptibility   Enterobacter aerogenes - MIC*    CEFAZOLIN >=64  RESISTANT Resistant     CEFTRIAXONE <=1 SENSITIVE Sensitive     CIPROFLOXACIN <=0.25 SENSITIVE Sensitive     GENTAMICIN <=1 SENSITIVE Sensitive     IMIPENEM 2 SENSITIVE Sensitive     NITROFURANTOIN 128 RESISTANT Resistant     TRIMETH/SULFA <=20 SENSITIVE Sensitive     PIP/TAZO <=4 SENSITIVE Sensitive     * >=100,000 COLONIES/mL ENTEROBACTER AEROGENES  Culture, respiratory (non-expectorated)     Status: None   Collection Time: 10/24/18  2:43 PM  Result Value Ref Range Status   Specimen Description TRACHEAL ASPIRATE  Final   Special Requests NONE  Final   Gram Stain   Final    FEW WBC PRESENT, PREDOMINANTLY PMN RARE GRAM POSITIVE RODS    Culture   Final    FEW Consistent with normal respiratory flora. Performed at [facility], [address] [place], [place] [address]    Report Status 10/27/2018 FINAL  Final  Culture, respiratory (non-expectorated)     Status: None   Collection Time: 11/01/18  3:30 PM  Result Value Ref Range Status   Specimen Description TRACHEAL ASPIRATE  Final   Special Requests NONE  Final   Gram Stain   Final    ABUNDANT WBC PRESENT, PREDOMINANTLY PMN ABUNDANT GRAM POSITIVE RODS MODERATE GRAM NEGATIVE RODS FEW YEAST RARE GRAM POSITIVE COCCI Performed at [facility] Lab, [address], [place], [place] [address]    Culture FEW ENTEROBACTER AEROGENES  Final  Report Status 11/03/2018 FINAL  Final   Organism ID, Bacteria ENTEROBACTER AEROGENES  Final      Susceptibility   Enterobacter aerogenes - MIC*    CEFAZOLIN >=64 RESISTANT Resistant     CEFEPIME <=1 SENSITIVE Sensitive     CEFTAZIDIME <=1 SENSITIVE Sensitive     CEFTRIAXONE <=1 SENSITIVE Sensitive     CIPROFLOXACIN <=0.25 SENSITIVE Sensitive     GENTAMICIN <=1 SENSITIVE Sensitive     IMIPENEM 2 SENSITIVE Sensitive     TRIMETH/SULFA <=20 SENSITIVE Sensitive     PIP/TAZO 8 SENSITIVE Sensitive     * FEW ENTEROBACTER AEROGENES    Coagulation Studies: No results for input(s): LABPROT,  INR in the last 72 hours.  Urinalysis: No results for input(s): COLORURINE, LABSPEC, PHURINE, GLUCOSEU, HGBUR, BILIRUBINUR, KETONESUR, PROTEINUR, UROBILINOGEN, NITRITE, LEUKOCYTESUR in the last 72 hours.  Invalid input(s): APPERANCEUR    Imaging: No results found.   Medications:     iopamidol  Assessment/ Plan:  68 y.o. male with a PMHx of CVA with intracranial hemorrhage s/p EVD, hypertension, encephalopathy, hypernatremia, acute respiratory failure, acute renal failure, hypernatremia who was admitted to [facility] on 10/19/2018 for ongoing care.  1.  Acute renal failure, nonoliguric. 2.  Hypernatremia.  3.  Metabolic acidosis.  4.  Hypertension.   Plan:  Patient sitting up in chair this a.m.  It appears that he is made some progress.  Recommend periodically checking renal function as well as serum sodium to ensure that renal function is stable.  Patient with good urine output at the moment.  Otherwise continue supportive care.   LOS: 0 Shalia Bartko 1/13/20208:55 AM

## 2018-11-06 NOTE — Progress Notes (Signed)
[facility] [place]   PULMONARY CRITICAL CARE SERVICE  PROGRESS NOTE  Date of Service: 11/06/2018  Darryl KronerCharles D Vargas  ZOX:[id number]  DOB: 08-21-1951   DOA: 10/19/2018  Referring Physician: Carron CurieAli Hijazi, MD  HPI: Darryl KronerCharles D Vargas is a 68 y.o. male seen for follow up of Acute on Chronic Respiratory Failure.  Patient right now is comfortable without distress is on T collar has been on 28% FiO2  Medications: Reviewed on Rounds  Physical Exam:  Vitals: Temperature 97.6 pulse 79 respiratory rate 28 blood pressure 128/66 saturations 98%  Ventilator Settings currently off the ventilator on T collar FiO2 28%  . General: Comfortable at this time . Eyes: Grossly normal lids, irises & conjunctiva . ENT: grossly tongue is normal . Neck: no obvious mass . Cardiovascular: S1 S2 normal no gallop . Respiratory: No rhonchi or rales are noted at this time . Abdomen: soft . Skin: no rash seen on limited exam . Musculoskeletal: not rigid . Psychiatric:unable to assess . Neurologic: no seizure no involuntary movements         Lab Data:   Basic Metabolic Panel: No results for input(s): NA, K, CL, CO2, GLUCOSE, BUN, CREATININE, CALCIUM, MG, PHOS in the last 168 hours.  ABG: No results for input(s): PHART, PCO2ART, PO2ART, HCO3, O2SAT in the last 168 hours.  Liver Function Tests: No results for input(s): AST, ALT, ALKPHOS, BILITOT, PROT, ALBUMIN in the last 168 hours. No results for input(s): LIPASE, AMYLASE in the last 168 hours. No results for input(s): AMMONIA in the last 168 hours.  CBC: Recent Labs  Lab 11/02/18 0508  WBC 11.9*  HGB 11.3*  HCT 36.7*  MCV 97.9  PLT 201    Cardiac Enzymes: No results for input(s): CKTOTAL, CKMB, CKMBINDEX, TROPONINI in the last 168 hours.  BNP (last 3 results) No results for input(s): BNP in the last 8760 hours.  ProBNP (last 3 results) No results for input(s): PROBNP in the last 8760  hours.  Radiological Exams: No results found.  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 Acute on chronic respiratory failure with hypoxia we will continue with T collar continue pulmonary toilet secretion management. 2. Nontraumatic intracranial hemorrhage we will continue with supportive care 3. ATN at baseline 4. DVT treated we will continue to follow 5. Acute anoxic encephalopathy at baseline continue present management   I have personally seen and evaluated the patient, evaluated laboratory and imaging results, formulated the assessment and plan and placed orders. The Patient requires high complexity decision making for assessment and support.  Case was discussed on Rounds with the Respiratory Therapy Staff  Yevonne PaxSaadat A Saxton Chain, MD [facility] Pulmonary Critical Care Medicine Sleep Medicine

## 2018-11-07 DIAGNOSIS — G931 Anoxic brain damage, not elsewhere classified: Secondary | ICD-10-CM | POA: Diagnosis not present

## 2018-11-07 DIAGNOSIS — I824Z3 Acute embolism and thrombosis of unspecified deep veins of distal lower extremity, bilateral: Secondary | ICD-10-CM | POA: Diagnosis not present

## 2018-11-07 DIAGNOSIS — J9621 Acute and chronic respiratory failure with hypoxia: Secondary | ICD-10-CM | POA: Diagnosis not present

## 2018-11-07 DIAGNOSIS — N170 Acute kidney failure with tubular necrosis: Secondary | ICD-10-CM | POA: Diagnosis not present

## 2018-11-07 NOTE — Progress Notes (Signed)
Pulmonary Critical Care Medicine [facility] [place]   PULMONARY CRITICAL CARE SERVICE  PROGRESS NOTE  Date of Service: 11/07/2018  Darryl Vargas  [id number]:[id number]  DOB: February 09, 1951   DOA: 10/19/2018  Referring Physician: Carron Curie, MD  HPI: Darryl Vargas is a 68 y.o. male seen for follow up of Acute on Chronic Respiratory Failure.  Patient is on T collar at this time is comfortable without distress has been on 28% FiO2 no desaturations are noted  Medications: Reviewed on Rounds  Physical Exam:  Vitals: Temperature 99.2 pulse 89 respiratory rate 26 blood pressure 134/74 saturations 98%  Ventilator Settings currently is off the ventilator on T collar  . General: Comfortable at this time . Eyes: Grossly normal lids, irises & conjunctiva . ENT: grossly tongue is normal . Neck: no obvious mass . Cardiovascular: S1 S2 normal no gallop . Respiratory: No rhonchi or rales are noted . Abdomen: soft . Skin: no rash seen on limited exam . Musculoskeletal: not rigid . Psychiatric:unable to assess . Neurologic: no seizure no involuntary movements         Lab Data:   Basic Metabolic Panel: No results for input(s): NA, K, CL, CO2, GLUCOSE, BUN, CREATININE, CALCIUM, MG, PHOS in the last 168 hours.  ABG: No results for input(s): PHART, PCO2ART, PO2ART, HCO3, O2SAT in the last 168 hours.  Liver Function Tests: No results for input(s): AST, ALT, ALKPHOS, BILITOT, PROT, ALBUMIN in the last 168 hours. No results for input(s): LIPASE, AMYLASE in the last 168 hours. No results for input(s): AMMONIA in the last 168 hours.  CBC: Recent Labs  Lab 11/02/18 0508  WBC 11.9*  HGB 11.3*  HCT 36.7*  MCV 97.9  PLT 201    Cardiac Enzymes: No results for input(s): CKTOTAL, CKMB, CKMBINDEX, TROPONINI in the last 168 hours.  BNP (last 3 results) No results for input(s): BNP in the last 8760 hours.  ProBNP (last 3 results) No results for input(s): PROBNP in the last 8760  hours.  Radiological Exams: No results found.  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 Acute on chronic respiratory failure with hypoxia we will continue with T collar as ordered continue secretion management pulmonary toilet. 2. Nontraumatic intracranial hemorrhage unchanged 3. Acute tubular necrosis followed by nephrology 4. DVT treated 5. Anoxic encephalopathy unchanged   I have personally seen and evaluated the patient, evaluated laboratory and imaging results, formulated the assessment and plan and placed orders. The Patient requires high complexity decision making for assessment and support.  Case was discussed on Rounds with the Respiratory Therapy Staff  Yevonne Pax, MD [facility] Pulmonary Critical Care Medicine Sleep Medicine

## 2018-11-08 ENCOUNTER — Other Ambulatory Visit (HOSPITAL_COMMUNITY): Payer: Medicare Other

## 2018-11-08 DIAGNOSIS — I824Z3 Acute embolism and thrombosis of unspecified deep veins of distal lower extremity, bilateral: Secondary | ICD-10-CM | POA: Diagnosis not present

## 2018-11-08 DIAGNOSIS — N170 Acute kidney failure with tubular necrosis: Secondary | ICD-10-CM | POA: Diagnosis not present

## 2018-11-08 DIAGNOSIS — G931 Anoxic brain damage, not elsewhere classified: Secondary | ICD-10-CM | POA: Diagnosis not present

## 2018-11-08 DIAGNOSIS — J9621 Acute and chronic respiratory failure with hypoxia: Secondary | ICD-10-CM | POA: Diagnosis not present

## 2018-11-08 NOTE — Progress Notes (Signed)
Pulmonary Critical Care Medicine [facility] [place]   PULMONARY CRITICAL CARE SERVICE  PROGRESS NOTE  Date of Service: 11/08/2018  FINNLY BRIETZKE  [id number]  DOB: 1951-10-01   DOA: 10/19/2018  Referring Physician: Carron Curie, MD  HPI: Darryl Vargas is a 68 y.o. male seen for follow up of Acute on Chronic Respiratory Failure.  Patient is on T collar comfortable without distress we should be able to downsize his trach today  Medications: Reviewed on Rounds  Physical Exam:  Vitals: Temperature 97.4 pulse 79 respiratory rate 24 blood pressure 117/64 saturations are 98%  Ventilator Settings off the ventilator on T collar currently  . General: Comfortable at this time . Eyes: Grossly normal lids, irises & conjunctiva . ENT: grossly tongue is normal . Neck: no obvious mass . Cardiovascular: S1 S2 normal no gallop . Respiratory: No rhonchi or rales are noted at this time . Abdomen: soft . Skin: no rash seen on limited exam . Musculoskeletal: not rigid . Psychiatric:unable to assess . Neurologic: no seizure no involuntary movements         Lab Data:   Basic Metabolic Panel: No results for input(s): NA, K, CL, CO2, GLUCOSE, BUN, CREATININE, CALCIUM, MG, PHOS in the last 168 hours.  ABG: No results for input(s): PHART, PCO2ART, PO2ART, HCO3, O2SAT in the last 168 hours.  Liver Function Tests: No results for input(s): AST, ALT, ALKPHOS, BILITOT, PROT, ALBUMIN in the last 168 hours. No results for input(s): LIPASE, AMYLASE in the last 168 hours. No results for input(s): AMMONIA in the last 168 hours.  CBC: Recent Labs  Lab 11/02/18 0508  WBC 11.9*  HGB 11.3*  HCT 36.7*  MCV 97.9  PLT 201    Cardiac Enzymes: No results for input(s): CKTOTAL, CKMB, CKMBINDEX, TROPONINI in the last 168 hours.  BNP (last 3 results) No results for input(s): BNP in the last 8760 hours.  ProBNP (last 3 results) No results for input(s): PROBNP in the last 8760  hours.  Radiological Exams: No results found.  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 Acute on chronic respiratory failure with hypoxia we will continue with full support and change his trach out to a #6 cuffless 2. Nontraumatic intracranial hemorrhage unchanged we will continue with supportive care 3. Acute tubular necrosis resolved 4. DVT treated 5. Anoxic encephalopathy unchanged   I have personally s I he had texted and there is still waiting for the bupropion, once that is done With Drive is regular and evaluated the patient, evaluated laboratory and imaging results, formulated the assessment and plan and placed orders. The Patient requires high complexity decision making for assessment and support.  Case was discussed on Rounds with the Respiratory Therapy Staff  Yevonne Pax, MD [facility] Pulmonary Critical Care Medicine Sleep Medicine

## 2018-11-09 DIAGNOSIS — I824Z3 Acute embolism and thrombosis of unspecified deep veins of distal lower extremity, bilateral: Secondary | ICD-10-CM | POA: Diagnosis not present

## 2018-11-09 DIAGNOSIS — G931 Anoxic brain damage, not elsewhere classified: Secondary | ICD-10-CM | POA: Diagnosis not present

## 2018-11-09 DIAGNOSIS — J9621 Acute and chronic respiratory failure with hypoxia: Secondary | ICD-10-CM | POA: Diagnosis not present

## 2018-11-09 DIAGNOSIS — N170 Acute kidney failure with tubular necrosis: Secondary | ICD-10-CM | POA: Diagnosis not present

## 2018-11-09 NOTE — Progress Notes (Signed)
Pulmonary Critical Care Medicine [facility] [place]   PULMONARY CRITICAL CARE SERVICE  PROGRESS NOTE  Date of Service: 11/09/2018  JATAVIAN KONOPKA  [id number]  DOB: August 12, 1951   DOA: 10/19/2018  Referring Physician: Carron Curie, MD  HPI: Darryl Vargas is a 68 y.o. male seen for follow up of Acute on Chronic Respiratory Failure.  At this time patient is on T collar doing well secretions are fair to moderate tracheostomy was changed he is tolerating it  Medications: Reviewed on Rounds  Physical Exam:  Vitals: Temperature 98.0 pulse 76 respiratory rate 22 blood pressure 156/76 saturations 94%  Ventilator Settings currently off the ventilator on T collar requiring 28% FiO2  . General: Comfortable at this time . Eyes: Grossly normal lids, irises & conjunctiva . ENT: grossly tongue is normal . Neck: no obvious mass . Cardiovascular: S1 S2 normal no gallop . Respiratory: No rhonchi or rales are noted at this time . Abdomen: soft . Skin: no rash seen on limited exam . Musculoskeletal: not rigid . Psychiatric:unable to assess . Neurologic: no seizure no involuntary movements         Lab Data:   Basic Metabolic Panel: No results for input(s): NA, K, CL, CO2, GLUCOSE, BUN, CREATININE, CALCIUM, MG, PHOS in the last 168 hours.  ABG: No results for input(s): PHART, PCO2ART, PO2ART, HCO3, O2SAT in the last 168 hours.  Liver Function Tests: No results for input(s): AST, ALT, ALKPHOS, BILITOT, PROT, ALBUMIN in the last 168 hours. No results for input(s): LIPASE, AMYLASE in the last 168 hours. No results for input(s): AMMONIA in the last 168 hours.  CBC: No results for input(s): WBC, NEUTROABS, HGB, HCT, MCV, PLT in the last 168 hours.  Cardiac Enzymes: No results for input(s): CKTOTAL, CKMB, CKMBINDEX, TROPONINI in the last 168 hours.  BNP (last 3 results) No results for input(s): BNP in the last 8760 hours.  ProBNP (last 3 results) No results for  input(s): PROBNP in the last 8760 hours.  Radiological Exams: Dg Chest Port 1 View  Result Date: 11/08/2018 CLINICAL DATA:  Hemoptysis. EXAM: PORTABLE CHEST 1 VIEW COMPARISON:  11/01/2018 and 10/18/2018 FINDINGS: Tracheostomy tube in place. Heart size and vascularity are normal. There is slight atelectasis at the left lung base, improved since the prior 2 exams. There are no infiltrates or effusions. No acute bone abnormality. IMPRESSION: Improving slight atelectasis at the left lung base. Electronically Signed   By: Francene Boyers M.D.   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 Acute on chronic respiratory failure with hypoxia we will continue with the T collar as tolerated continue pulmonary toilet. 2. Nontraumatic intracranial hemorrhage at baseline 3. ATN resolved 4. DVT treated improved 5. Anoxic encephalopathy grossly unchanged   I have personally seen and evaluated the patient, evaluated laboratory and imaging results, formulated the assessment and plan and placed orders. The Patient requires high complexity decision making for assessment and support.  Case was discussed on Rounds with the Respiratory Therapy Staff  Yevonne Pax, MD [facility] Pulmonary Critical Care Medicine Sleep Medicine

## 2018-11-10 DIAGNOSIS — J9621 Acute and chronic respiratory failure with hypoxia: Secondary | ICD-10-CM | POA: Diagnosis not present

## 2018-11-10 DIAGNOSIS — N170 Acute kidney failure with tubular necrosis: Secondary | ICD-10-CM | POA: Diagnosis not present

## 2018-11-10 DIAGNOSIS — G931 Anoxic brain damage, not elsewhere classified: Secondary | ICD-10-CM | POA: Diagnosis not present

## 2018-11-10 DIAGNOSIS — I824Z3 Acute embolism and thrombosis of unspecified deep veins of distal lower extremity, bilateral: Secondary | ICD-10-CM | POA: Diagnosis not present

## 2018-11-10 NOTE — Progress Notes (Signed)
[facility] [place]   PULMONARY CRITICAL CARE SERVICE  PROGRESS NOTE  Date of Service: 11/10/2018  Darryl Vargas  ZOX:[id number]  DOB: Mar 07, 1951   DOA: 10/19/2018  Referring Physician: Carron CurieAli Hijazi, MD  HPI: Darryl Vargas is a 68 y.o. male seen for follow up of Acute on Chronic Respiratory Failure.  Patient currently is on T collar on room air.  He is actually doing well not much in the way of neurological improvement overall  Medications: Reviewed on Rounds  Physical Exam:  Vitals: Temperature 99.5 pulse 85 respiratory rate 30 blood pressure 166/79 saturations 96%  Ventilator Settings currently on T collar FiO2 21%  . General: Comfortable at this time . Eyes: Grossly normal lids, irises & conjunctiva . ENT: grossly tongue is normal . Neck: no obvious mass . Cardiovascular: S1 S2 normal no gallop . Respiratory: No rhonchi or rales are noted at this time . Abdomen: soft . Skin: no rash seen on limited exam . Musculoskeletal: not rigid . Psychiatric:unable to assess . Neurologic: no seizure no involuntary movements         Lab Data:   Basic Metabolic Panel: No results for input(s): NA, K, CL, CO2, GLUCOSE, BUN, CREATININE, CALCIUM, MG, PHOS in the last 168 hours.  ABG: No results for input(s): PHART, PCO2ART, PO2ART, HCO3, O2SAT in the last 168 hours.  Liver Function Tests: No results for input(s): AST, ALT, ALKPHOS, BILITOT, PROT, ALBUMIN in the last 168 hours. No results for input(s): LIPASE, AMYLASE in the last 168 hours. No results for input(s): AMMONIA in the last 168 hours.  CBC: No results for input(s): WBC, NEUTROABS, HGB, HCT, MCV, PLT in the last 168 hours.  Cardiac Enzymes: No results for input(s): CKTOTAL, CKMB, CKMBINDEX, TROPONINI in the last 168 hours.  BNP (last 3 results) No results for input(s): BNP in the last 8760 hours.  ProBNP (last 3 results) No results for input(s): PROBNP in the last  8760 hours.  Radiological Exams: Dg Chest Port 1 View  Result Date: 11/08/2018 CLINICAL DATA:  Hemoptysis. EXAM: PORTABLE CHEST 1 VIEW COMPARISON:  11/01/2018 and 10/18/2018 FINDINGS: Tracheostomy tube in place. Heart size and vascularity are normal. There is slight atelectasis at the left lung base, improved since the prior 2 exams. There are no infiltrates or effusions. No acute bone abnormality. IMPRESSION: Improving slight atelectasis at the left lung base. Electronically Signed   By: Francene BoyersJames  Maxwell M.D.   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 Acute on chronic respiratory failure with hypoxia discussed on rounds we will make an attempt at starting PMV if patient is able to tolerate.  I do not know that he will ultimately be able to be decannulated as his airway protection is uncertain 2. Nontraumatic intracranial hemorrhage stays about the same we will continue with supportive care 3. ATN resolved 4. DVT treated 5. Anoxic encephalopathy grossly unchanged   I have personally seen and evaluated the patient, evaluated laboratory and imaging results, formulated the assessment and plan and placed orders. The Patient requires high complexity decision making for assessment and support.  Case was discussed on Rounds with the Respiratory Therapy Staff  Yevonne PaxSaadat A Carden Teel, MD [facility] Pulmonary Critical Care Medicine Sleep  Medicine

## 2018-11-11 DIAGNOSIS — G931 Anoxic brain damage, not elsewhere classified: Secondary | ICD-10-CM | POA: Diagnosis not present

## 2018-11-11 DIAGNOSIS — N170 Acute kidney failure with tubular necrosis: Secondary | ICD-10-CM | POA: Diagnosis not present

## 2018-11-11 DIAGNOSIS — J9621 Acute and chronic respiratory failure with hypoxia: Secondary | ICD-10-CM | POA: Diagnosis not present

## 2018-11-11 DIAGNOSIS — I824Z3 Acute embolism and thrombosis of unspecified deep veins of distal lower extremity, bilateral: Secondary | ICD-10-CM | POA: Diagnosis not present

## 2018-11-11 NOTE — Progress Notes (Signed)
Pulmonary Critical Care Medicine [facility] [place]   PULMONARY CRITICAL CARE SERVICE  PROGRESS NOTE  Date of Service: 11/11/2018  Darryl Vargas  [id number]  DOB: 06-26-1951   DOA: 10/19/2018  Referring Physician: Carron Curie, MD  HPI: Darryl Vargas is a 68 y.o. male seen for follow up of Acute on Chronic Respiratory Failure.  Patient currently is on T collar good saturations are noted.  Secretions are very thick and dry  Medications: Reviewed on Rounds  Physical Exam:  Vitals: Temperature is 98.9 pulse 80 respiratory 29 blood pressure 164/87 saturations 97%  Ventilator Settings currently on T collar FiO2 is 21%  . General: Comfortable at this time . Eyes: Grossly normal lids, irises & conjunctiva . ENT: grossly tongue is normal . Neck: no obvious mass . Cardiovascular: S1 S2 normal no gallop . Respiratory: No rhonchi or rales are noted at this time . Abdomen: soft . Skin: no rash seen on limited exam . Musculoskeletal: not rigid . Psychiatric:unable to assess . Neurologic: no seizure no involuntary movements         Lab Data:   Basic Metabolic Panel: No results for input(s): NA, K, CL, CO2, GLUCOSE, BUN, CREATININE, CALCIUM, MG, PHOS in the last 168 hours.  ABG: No results for input(s): PHART, PCO2ART, PO2ART, HCO3, O2SAT in the last 168 hours.  Liver Function Tests: No results for input(s): AST, ALT, ALKPHOS, BILITOT, PROT, ALBUMIN in the last 168 hours. No results for input(s): LIPASE, AMYLASE in the last 168 hours. No results for input(s): AMMONIA in the last 168 hours.  CBC: No results for input(s): WBC, NEUTROABS, HGB, HCT, MCV, PLT in the last 168 hours.  Cardiac Enzymes: No results for input(s): CKTOTAL, CKMB, CKMBINDEX, TROPONINI in the last 168 hours.  BNP (last 3 results) No results for input(s): BNP in the last 8760 hours.  ProBNP (last 3 results) No results for input(s): PROBNP in the last 8760 hours.  Radiological  Exams: No results found.  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 Acute on chronic respiratory failure with hypoxia we will continue with T collar patient not a candidate for decannulation secondary to secretions 2. Nontraumatic intracranial hemorrhage we will continue with supportive care therapy as tolerated 3. Acute tubular necrosis resolved 4. DVT treated 5. Anoxic encephalopathy unchanged   I have personally seen and evaluated the patient, evaluated laboratory and imaging results, formulated the assessment and plan and placed orders. The Patient requires high complexity decision making for assessment and support.  Case was discussed on Rounds with the Respiratory Therapy Staff  Yevonne Pax, MD [facility] Pulmonary Critical Care Medicine Sleep Medicine

## 2018-11-12 DIAGNOSIS — J9621 Acute and chronic respiratory failure with hypoxia: Secondary | ICD-10-CM | POA: Diagnosis not present

## 2018-11-12 DIAGNOSIS — G931 Anoxic brain damage, not elsewhere classified: Secondary | ICD-10-CM | POA: Diagnosis not present

## 2018-11-12 DIAGNOSIS — N170 Acute kidney failure with tubular necrosis: Secondary | ICD-10-CM | POA: Diagnosis not present

## 2018-11-12 DIAGNOSIS — I824Z3 Acute embolism and thrombosis of unspecified deep veins of distal lower extremity, bilateral: Secondary | ICD-10-CM | POA: Diagnosis not present

## 2018-11-12 NOTE — Progress Notes (Signed)
Pulmonary Critical Care Medicine [facility] [place]   PULMONARY CRITICAL CARE SERVICE  PROGRESS NOTE  Date of Service: 11/12/2018  Darryl Vargas  [id number]  DOB: 1951/02/10   DOA: 10/19/2018  Referring Physician: Carron Curie, MD  HPI: Darryl Vargas is a 68 y.o. male seen for follow up of Acute on Chronic Respiratory Failure.  Right now comfortable without distress patient is on room air patient remains on T collar though  Medications: Reviewed on Rounds  Physical Exam:  Vitals: Temperature 97.6 pulse 75 respiratory rate 22 blood pressure 135/75 saturations 100%  Ventilator Settings patient is right now on T collar on room air  . General: Comfortable at this time . Eyes: Grossly normal lids, irises & conjunctiva . ENT: grossly tongue is normal . Neck: no obvious mass . Cardiovascular: S1 S2 normal no gallop . Respiratory: No rhonchi or rales are noted at this time . Abdomen: soft . Skin: no rash seen on limited exam . Musculoskeletal: not rigid . Psychiatric:unable to assess . Neurologic: no seizure no involuntary movements         Lab Data:   Basic Metabolic Panel: No results for input(s): NA, K, CL, CO2, GLUCOSE, BUN, CREATININE, CALCIUM, MG, PHOS in the last 168 hours.  ABG: No results for input(s): PHART, PCO2ART, PO2ART, HCO3, O2SAT in the last 168 hours.  Liver Function Tests: No results for input(s): AST, ALT, ALKPHOS, BILITOT, PROT, ALBUMIN in the last 168 hours. No results for input(s): LIPASE, AMYLASE in the last 168 hours. No results for input(s): AMMONIA in the last 168 hours.  CBC: No results for input(s): WBC, NEUTROABS, HGB, HCT, MCV, PLT in the last 168 hours.  Cardiac Enzymes: No results for input(s): CKTOTAL, CKMB, CKMBINDEX, TROPONINI in the last 168 hours.  BNP (last 3 results) No results for input(s): BNP in the last 8760 hours.  ProBNP (last 3 results) No results for input(s): PROBNP in the last 8760  hours.  Radiological Exams: No results found.  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 Acute on chronic respiratory failure with hypoxia we will continue with the T collar as ordered patient is not able to be decannulated 2. Nontraumatic intracranial hemorrhage at baseline continue with supportive care 3. Acute tubular necrosis at baseline we will continue to follow 4. DVT lower extremity treated we will continue to monitor 5. Encephalopathy grossly unchanged we will continue to follow   I have personally seen and evaluated the patient, evaluated laboratory and imaging results, formulated the assessment and plan and placed orders. The Patient requires high complexity decision making for assessment and support.  Case was discussed on Rounds with the Respiratory Therapy Staff  Yevonne Pax, MD [facility] Pulmonary Critical Care Medicine Sleep Medicine

## 2018-11-13 DIAGNOSIS — G931 Anoxic brain damage, not elsewhere classified: Secondary | ICD-10-CM | POA: Diagnosis not present

## 2018-11-13 DIAGNOSIS — N170 Acute kidney failure with tubular necrosis: Secondary | ICD-10-CM | POA: Diagnosis not present

## 2018-11-13 DIAGNOSIS — I824Z3 Acute embolism and thrombosis of unspecified deep veins of distal lower extremity, bilateral: Secondary | ICD-10-CM | POA: Diagnosis not present

## 2018-11-13 DIAGNOSIS — J9621 Acute and chronic respiratory failure with hypoxia: Secondary | ICD-10-CM | POA: Diagnosis not present

## 2018-11-13 LAB — BASIC METABOLIC PANEL
Anion gap: 15 (ref 5–15)
BUN: 58 mg/dL — ABNORMAL HIGH (ref 8–23)
CO2: 18 mmol/L — ABNORMAL LOW (ref 22–32)
Calcium: 9.3 mg/dL (ref 8.9–10.3)
Chloride: 107 mmol/L (ref 98–111)
Creatinine, Ser: 1.01 mg/dL (ref 0.61–1.24)
GFR calc Af Amer: >60 mL/min (ref >60)
GFR calc non Af Amer: >60 mL/min (ref >60)
Glucose, Bld: 196 mg/dL — ABNORMAL HIGH (ref 70–99)
Potassium: 3.8 mmol/L (ref 3.5–5.1)
Sodium: 140 mmol/L (ref 135–145)

## 2018-11-13 LAB — CBC
HCT: 37.6 % — ABNORMAL LOW (ref 39.0–52.0)
Hemoglobin: 12.1 g/dL — ABNORMAL LOW (ref 13.0–17.0)
MCH: 31.1 pg (ref 26.0–34.0)
MCHC: 32.2 g/dL (ref 30.0–36.0)
MCV: 96.7 fL (ref 80.0–100.0)
Platelets: 234 10*3/uL (ref 150–400)
RBC: 3.89 MIL/uL — ABNORMAL LOW (ref 4.22–5.81)
RDW: 15.9 % — ABNORMAL HIGH (ref 11.5–15.5)
WBC: 13 10*3/uL — ABNORMAL HIGH (ref 4.0–10.5)
nRBC: 0 % (ref 0.0–0.2)

## 2018-11-13 NOTE — Progress Notes (Signed)
Pulmonary Critical Care Medicine [facility] [place]   PULMONARY CRITICAL CARE SERVICE  PROGRESS NOTE  Date of Service: 11/13/2018  Darryl Vargas  [id number]:[id number]  DOB: 21-Feb-1951   DOA: 10/19/2018  Referring Physician: Carron Curie, MD  HPI: Darryl Vargas is a 68 y.o. male seen for follow up of Acute on Chronic Respiratory Failure.  Patient is currently on T collar doing well has been on 28% FiO2 good saturations are noted at this time  Medications: Reviewed on Rounds  Physical Exam:  Vitals: Temperature 99.8 pulse 88 respiratory 24 blood pressure 104/59 saturations 95%  Ventilator Settings off the ventilator on T collar  . General: Comfortable at this time . Eyes: Grossly normal lids, irises & conjunctiva . ENT: grossly tongue is normal . Neck: no obvious mass . Cardiovascular: S1 S2 normal no gallop . Respiratory: No rhonchi or rales are noted at this time . Abdomen: soft . Skin: no rash seen on limited exam . Musculoskeletal: not rigid . Psychiatric:unable to assess . Neurologic: no seizure no involuntary movements         Lab Data:   Basic Metabolic Panel: Recent Labs  Lab 11/13/18 0953  NA 140  K 3.8  CL 107  CO2 18*  GLUCOSE 196*  BUN 58*  CREATININE 1.01  CALCIUM 9.3    ABG: No results for input(s): PHART, PCO2ART, PO2ART, HCO3, O2SAT in the last 168 hours.  Liver Function Tests: No results for input(s): AST, ALT, ALKPHOS, BILITOT, PROT, ALBUMIN in the last 168 hours. No results for input(s): LIPASE, AMYLASE in the last 168 hours. No results for input(s): AMMONIA in the last 168 hours.  CBC: Recent Labs  Lab 11/13/18 0953  WBC 13.0*  HGB 12.1*  HCT 37.6*  MCV 96.7  PLT 234    Cardiac Enzymes: No results for input(s): CKTOTAL, CKMB, CKMBINDEX, TROPONINI in the last 168 hours.  BNP (last 3 results) No results for input(s): BNP in the last 8760 hours.  ProBNP (last 3 results) No results for input(s): PROBNP in the last  8760 hours.  Radiological Exams: No results found.  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 Acute on chronic respiratory failure with hypoxia we will continue with T collar trials titrate oxygen as tolerated continue using PMV also patient is doing well with it 2. Nontraumatic intracranial hemorrhage unchanged we will continue with supportive care 3. ATN resolved 4. DVT treated we will continue to monitor 5. Anoxic encephalopathy grossly unchanged   I have personally seen and evaluated the patient, evaluated laboratory and imaging results, formulated the assessment and plan and placed orders. The Patient requires high complexity decision making for assessment and support.  Case was discussed on Rounds with the Respiratory Therapy Staff  Yevonne Pax, MD [facility] Pulmonary Critical Care Medicine Sleep Medicine

## 2018-11-14 DIAGNOSIS — I824Z3 Acute embolism and thrombosis of unspecified deep veins of distal lower extremity, bilateral: Secondary | ICD-10-CM | POA: Diagnosis not present

## 2018-11-14 DIAGNOSIS — J9621 Acute and chronic respiratory failure with hypoxia: Secondary | ICD-10-CM | POA: Diagnosis not present

## 2018-11-14 DIAGNOSIS — G931 Anoxic brain damage, not elsewhere classified: Secondary | ICD-10-CM | POA: Diagnosis not present

## 2018-11-14 DIAGNOSIS — N170 Acute kidney failure with tubular necrosis: Secondary | ICD-10-CM | POA: Diagnosis not present

## 2018-11-14 NOTE — Progress Notes (Signed)
[facility] [place]   PULMONARY CRITICAL CARE SERVICE  PROGRESS NOTE  Date of Service: 11/14/2018  Darryl Vargas  DOB: December 27, 1950   DOA: 10/19/2018  Referring Physician: Carron CurieAli Hijazi, MD  HPI: Darryl Vargas seen for follow up of Acute on Chronic Respiratory Failure.  Remains on T collar right now is on room air has been tolerating PMV.  Secretions are reportedly minimal  Medications: Reviewed on Rounds  Physical Exam:  Vitals: Temperature 98.8 pulse 93 respiratory 20 blood pressure 153/85 saturations 95%  Ventilator Settings off the ventilator on T collar  . General: Comfortable at this time . Eyes: Grossly normal lids, irises & conjunctiva . ENT: grossly tongue is normal . Neck: no obvious mass . Cardiovascular: S1 S2 normal no gallop . Respiratory: No rhonchi or rales are noted at this time . Abdomen: soft . Skin: no rash seen on limited exam . Musculoskeletal: not rigid . Psychiatric:unable to assess . Neurologic: no seizure no involuntary movements         Lab Data:   Basic Metabolic Panel: Recent Labs  Lab 11/13/18 0953  NA 140  K 3.8  CL 107  CO2 18*  GLUCOSE 196*  BUN 58*  CREATININE 1.01  CALCIUM 9.3    ABG: No results for input(s): PHART, PCO2ART, PO2ART, HCO3, O2SAT in the last 168 hours.  Liver Function Tests: No results for input(s): AST, ALT, ALKPHOS, BILITOT, PROT, ALBUMIN in the last 168 hours. No results for input(s): LIPASE, AMYLASE in the last 168 hours. No results for input(s): AMMONIA in the last 168 hours.  CBC: Recent Labs  Lab 11/13/18 0953  WBC 13.0*  HGB 12.1*  HCT 37.6*  MCV 96.7  PLT 234    Cardiac Enzymes: No results for input(s): CKTOTAL, CKMB, CKMBINDEX, TROPONINI in the last 168 hours.  BNP (last 3 results) No results for input(s): BNP in the last 8760 hours.  ProBNP (last 3 results) No results for input(s): PROBNP in the last  8760 hours.  Radiological Exams: No results found.  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 Patient has been tolerating the T collar well with the PMV.  I am asking for him to be starting capping trials tomorrow. 2. Nontraumatic intracranial hemorrhage at baseline we will continue with supportive care nonverbal. 3. Acute tubular necrosis at baseline continue with present management 4. DVT treated we will continue with present management 5. Anoxic encephalopathy at baseline we will continue with supportive care   I have personally seen and evaluated the patient, evaluated laboratory and imaging results, formulated the assessment and plan and placed orders. The Patient requires high complexity decision making for assessment and support.  Case was discussed on Rounds with the Respiratory Therapy Staff  Yevonne PaxSaadat A Khan, MD [facility] Pulmonary Critical Care Medicine Sleep Medicine

## 2018-11-15 DIAGNOSIS — G931 Anoxic brain damage, not elsewhere classified: Secondary | ICD-10-CM | POA: Diagnosis not present

## 2018-11-15 DIAGNOSIS — I824Z3 Acute embolism and thrombosis of unspecified deep veins of distal lower extremity, bilateral: Secondary | ICD-10-CM | POA: Diagnosis not present

## 2018-11-15 DIAGNOSIS — J9621 Acute and chronic respiratory failure with hypoxia: Secondary | ICD-10-CM | POA: Diagnosis not present

## 2018-11-15 DIAGNOSIS — N170 Acute kidney failure with tubular necrosis: Secondary | ICD-10-CM | POA: Diagnosis not present

## 2018-11-15 NOTE — Progress Notes (Signed)
Pulmonary Critical Care Medicine [facility] [place]   PULMONARY CRITICAL CARE SERVICE  PROGRESS NOTE  Date of Service: 11/15/2018  Darryl Vargas  ZPH:[id number]  DOB: [birth year]   DOA: 10/19/2018  Referring Physician: Carron Curie, MD  HPI: Darryl Vargas is a 68 y.o. male seen for follow up of Acute on Chronic Respiratory Failure.  At this time patient is doing well he sitting up in a chair looks good with PMV in place  Medications: Reviewed on Rounds  Physical Exam:  Vitals: Temperature 97.4 pulse 71 respiratory 22 blood pressure 153/70 saturations 96%  Ventilator Settings currently is off the ventilator on T collar  . General: Comfortable at this time . Eyes: Grossly normal lids, irises & conjunctiva . ENT: grossly tongue is normal . Neck: no obvious mass . Cardiovascular: S1 S2 normal no gallop . Respiratory: No rhonchi or rales are noted at this time . Abdomen: soft . Skin: no rash seen on limited exam . Musculoskeletal: not rigid . Psychiatric:unable to assess . Neurologic: no seizure no involuntary movements         Lab Data:   Basic Metabolic Panel: Recent Labs  Lab 11/13/18 0953  NA 140  K 3.8  CL 107  CO2 18*  GLUCOSE 196*  BUN 58*  CREATININE 1.01  CALCIUM 9.3    ABG: No results for input(s): PHART, PCO2ART, PO2ART, HCO3, O2SAT in the last 168 hours.  Liver Function Tests: No results for input(s): AST, ALT, ALKPHOS, BILITOT, PROT, ALBUMIN in the last 168 hours. No results for input(s): LIPASE, AMYLASE in the last 168 hours. No results for input(s): AMMONIA in the last 168 hours.  CBC: Recent Labs  Lab 11/13/18 0953  WBC 13.0*  HGB 12.1*  HCT 37.6*  MCV 96.7  PLT 234    Cardiac Enzymes: No results for input(s): CKTOTAL, CKMB, CKMBINDEX, TROPONINI in the last 168 hours.  BNP (last 3 results) No results for input(s): BNP in the last 8760 hours.  ProBNP (last 3 results) No results for input(s): PROBNP in the last  8760 hours.  Radiological Exams: No results found.  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 Acute on chronic respiratory failure with hypoxia we will continue with T collar trials titrate oxygen continue pulmonary toilet patient is also going to be attempted at capping 2. Nontraumatic intracranial hemorrhage unchanged continue with supportive care 3. ATN at baseline 4. DVT treated 5. Anoxic encephalopathy unchanged we will continue present therapy   I have personally seen and evaluated the patient, evaluated laboratory and imaging results, formulated the assessment and plan and placed orders. The Patient requires high complexity decision making for assessment and support.  Case was discussed on Rounds with the Respiratory Therapy Staff  Yevonne Pax, MD [facility] Pulmonary Critical Care Medicine Sleep Medicine

## 2018-11-16 ENCOUNTER — Inpatient Hospital Stay (HOSPITAL_COMMUNITY)
Admission: AD | Admit: 2018-11-16 | Discharge: 2018-11-16 | Disposition: A | Discharge status: Home / Self Care | Payer: Medicare Other | Attending: Internal Medicine | Admitting: Internal Medicine

## 2018-11-16 ENCOUNTER — Other Ambulatory Visit (HOSPITAL_COMMUNITY): Payer: Medicare Other

## 2018-11-16 DIAGNOSIS — N170 Acute kidney failure with tubular necrosis: Secondary | ICD-10-CM | POA: Diagnosis not present

## 2018-11-16 DIAGNOSIS — G931 Anoxic brain damage, not elsewhere classified: Secondary | ICD-10-CM | POA: Diagnosis not present

## 2018-11-16 DIAGNOSIS — I824Z3 Acute embolism and thrombosis of unspecified deep veins of distal lower extremity, bilateral: Secondary | ICD-10-CM | POA: Diagnosis not present

## 2018-11-16 DIAGNOSIS — J9621 Acute and chronic respiratory failure with hypoxia: Secondary | ICD-10-CM | POA: Diagnosis not present

## 2018-11-16 NOTE — Progress Notes (Addendum)
Pulmonary Critical Care Medicine [facility] [place]   PULMONARY CRITICAL CARE SERVICE  PROGRESS NOTE  Date of Service: 11/16/2018  KASHDON DELMORE  ZRA:[id number]  DOB: [birth year]   DOA: 10/19/2018  Referring Physician: Carron Curie, MD  HPI: KORION NGO is a 68 y.o. male seen for follow up of Acute on Chronic Respiratory Failure.  Patient continues to do well he has been capped for 24 hours.  He is currently on room air.  Medications: Reviewed on Rounds  Physical Exam:  Vitals: Pulse 79 respirations 22 blood pressure 137/67 O2 sat 97% temp 97.5  Ventilator Settings patient not on ventilator.  . General: Comfortable at this time . Eyes: Grossly normal lids, irises & conjunctiva . ENT: grossly tongue is normal . Neck: no obvious mass . Cardiovascular: S1 S2 normal no gallop . Respiratory: No rales or rhonchi noted . Abdomen: soft . Skin: no rash seen on limited exam . Musculoskeletal: not rigid . Psychiatric:unable to assess . Neurologic: no seizure no involuntary movements         Lab Data:   Basic Metabolic Panel: Recent Labs  Lab 11/13/18 0953  NA 140  K 3.8  CL 107  CO2 18*  GLUCOSE 196*  BUN 58*  CREATININE 1.01  CALCIUM 9.3    ABG: No results for input(s): PHART, PCO2ART, PO2ART, HCO3, O2SAT in the last 168 hours.  Liver Function Tests: No results for input(s): AST, ALT, ALKPHOS, BILITOT, PROT, ALBUMIN in the last 168 hours. No results for input(s): LIPASE, AMYLASE in the last 168 hours. No results for input(s): AMMONIA in the last 168 hours.  CBC: Recent Labs  Lab 11/13/18 0953  WBC 13.0*  HGB 12.1*  HCT 37.6*  MCV 96.7  PLT 234    Cardiac Enzymes: No results for input(s): CKTOTAL, CKMB, CKMBINDEX, TROPONINI in the last 168 hours.  BNP (last 3 results) No results for input(s): BNP in the last 8760 hours.  ProBNP (last 3 results) No results for input(s): PROBNP in the last 8760 hours.  Radiological Exams: Ct Head  Wo Contrast  Result Date: 11/16/2018 CLINICAL DATA:  Follow up CVA. EXAM: CT HEAD WITHOUT CONTRAST TECHNIQUE: Contiguous axial images were obtained from the base of the skull through the vertex without intravenous contrast. COMPARISON:  None available. FINDINGS: Brain: Remote appearing left temporoparietal infarct involving the basal ganglia with encephalomalacia and ex vacuo dilatation of the left lateral ventricle. No hemorrhage is identified. No new/acute intracranial findings and no extra-axial fluid collections are identified. Evidence of prior craniotomies. The brainstem and cerebellum are grossly normal. Vascular: Stable vascular calcifications. No focal aneurysm or hyperdense vessels. Skull: No skull fracture or bone lesions. Left frontal and right high parietal craniotomies are noted. Sinuses/Orbits: The paranasal sinuses and mastoid air cells are clear except for small mastoid effusions on the right. The globes are intact. Other: No scalp lesions or hematoma. IMPRESSION: 1. Fairly extensive, chronic appearing, left-sided infarct with encephalomalacia and ex vacuo dilatation of the left lateral ventricle. 2. No acute intracranial findings, intracranial hemorrhage or extra-axial fluid collections. Electronically Signed   By: Rudie Meyer M.D.   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 Acute on chronic respiratory failure with hypoxia we will continue  with capping per protocol.  Continue pulmonary toilet as needed. 2. Nontraumatic intracranial hemorrhage unchanged continue supportive care 3. HTN at baseline 4. DVT treated 5. Anoxic encephalopathy unchanged we will continue present therapy   I have personally seen and evaluated  the patient, evaluated laboratory and imaging results, formulated the assessment and plan and placed orders. The Patient requires high complexity decision making for assessment and support.  Case was discussed on Rounds with the Respiratory Therapy Staff  Yevonne PaxSaadat A Gibson Telleria, MD [facility] Pulmonary Critical Care Medicine Sleep Medicine

## 2018-11-16 NOTE — Procedures (Signed)
History: 68 year old male being evaluated for encephalopathy  Sedation: None  Technique: This is a 21 channel routine scalp EEG performed at the bedside with bipolar and monopolar montages arranged in accordance to the international 10/20 system of electrode placement. One channel was dedicated to EKG recording.    Background: The background consists predominantly of 5 to 6 Hz activity which does have a posterior predominance, though is distributed with a wide field.  No clear sleep structures were seen.  There was no epileptiform activity recorded.  Photic stimulation: Physiologic driving is not performed  EEG Abnormalities: 1) slowing of the background activity  Clinical Interpretation: This EEG is consistent with a generalized non-specific cerebral dysfunction(encephalopathy). There was no seizure or seizure predisposition recorded on this study.   Ritta Slot, MD [facility] [phone]  If 7pm- 7am, please page neurology on call as listed in AMION.

## 2018-11-16 NOTE — Progress Notes (Signed)
EEG completed; results pending.    

## 2018-11-17 ENCOUNTER — Other Ambulatory Visit (HOSPITAL_COMMUNITY): Payer: Medicare Other

## 2018-11-17 DIAGNOSIS — I824Z3 Acute embolism and thrombosis of unspecified deep veins of distal lower extremity, bilateral: Secondary | ICD-10-CM | POA: Diagnosis not present

## 2018-11-17 DIAGNOSIS — G931 Anoxic brain damage, not elsewhere classified: Secondary | ICD-10-CM | POA: Diagnosis not present

## 2018-11-17 DIAGNOSIS — N170 Acute kidney failure with tubular necrosis: Secondary | ICD-10-CM | POA: Diagnosis not present

## 2018-11-17 DIAGNOSIS — J9621 Acute and chronic respiratory failure with hypoxia: Secondary | ICD-10-CM | POA: Diagnosis not present

## 2018-11-17 LAB — URINALYSIS, ROUTINE W REFLEX MICROSCOPIC
Bilirubin Urine: NEGATIVE
Glucose, UA: NEGATIVE mg/dL
Ketones, ur: NEGATIVE mg/dL
Nitrite: NEGATIVE
Protein, ur: 100 mg/dL — AB
RBC / HPF: >50 RBC/hpf — ABNORMAL HIGH (ref 0–5)
Specific Gravity, Urine: 1.019 (ref 1.005–1.030)
pH: 5 (ref 5.0–8.0)

## 2018-11-17 LAB — BASIC METABOLIC PANEL
Anion gap: 11 (ref 5–15)
BUN: 72 mg/dL — AB (ref 8–23)
CO2: 22 mmol/L (ref 22–32)
Calcium: 9.1 mg/dL (ref 8.9–10.3)
Chloride: 107 mmol/L (ref 98–111)
Creatinine, Ser: 1.03 mg/dL (ref 0.61–1.24)
GFR calc Af Amer: >60 mL/min (ref >60)
GFR calc non Af Amer: >60 mL/min (ref >60)
GLUCOSE: 157 mg/dL — AB (ref 70–99)
Potassium: 3.3 mmol/L — ABNORMAL LOW (ref 3.5–5.1)
Sodium: 140 mmol/L (ref 135–145)

## 2018-11-17 LAB — CBC
HCT: 32.7 % — ABNORMAL LOW (ref 39.0–52.0)
Hemoglobin: 10.3 g/dL — ABNORMAL LOW (ref 13.0–17.0)
MCH: 30.2 pg (ref 26.0–34.0)
MCHC: 31.5 g/dL (ref 30.0–36.0)
MCV: 95.9 fL (ref 80.0–100.0)
Platelets: 235 10*3/uL (ref 150–400)
RBC: 3.41 MIL/uL — ABNORMAL LOW (ref 4.22–5.81)
RDW: 15.9 % — AB (ref 11.5–15.5)
WBC: 14.9 10*3/uL — ABNORMAL HIGH (ref 4.0–10.5)
nRBC: 0 % (ref 0.0–0.2)

## 2018-11-17 NOTE — Progress Notes (Signed)
Pulmonary Critical Care Medicine [facility] [place]   PULMONARY CRITICAL CARE SERVICE  PROGRESS NOTE  Date of Service: 11/17/2018  Darryl Vargas  ZOX:[id number]  DOB: 1951-10-20   DOA: 10/19/2018  Referring Physician: Carron Curie, MD  HPI: Darryl Vargas is a 68 y.o. male seen for follow up of Acute on Chronic Respiratory Failure.  Patient currently is resting comfortably and has been capping doing fairly well.  Remains on capping trials for more than 48 hours now.  EEG showed some slowing of background activity but it was basically nonspecific with no seizure or seizure predisposition noted  Medications: Reviewed on Rounds  Physical Exam:  Vitals: Temperature 99.9 pulse 98 respiratory 28 blood pressure 131/65 saturations 97%  Ventilator Settings currently capping off the ventilator  . General: Comfortable at this time . Eyes: Grossly normal lids, irises & conjunctiva . ENT: grossly tongue is normal . Neck: no obvious mass . Cardiovascular: S1 S2 normal no gallop . Respiratory: No rhonchi or rales are noted at this time . Abdomen: soft . Skin: no rash seen on limited exam . Musculoskeletal: not rigid . Psychiatric:unable to assess . Neurologic: no seizure no involuntary movements         Lab Data:   Basic Metabolic Panel: Recent Labs  Lab 11/13/18 0953 11/17/18 0606  NA 140 140  K 3.8 3.3*  CL 107 107  CO2 18* 22  GLUCOSE 196* 157*  BUN 58* 72*  CREATININE 1.01 1.03  CALCIUM 9.3 9.1    ABG: No results for input(s): PHART, PCO2ART, PO2ART, HCO3, O2SAT in the last 168 hours.  Liver Function Tests: No results for input(s): AST, ALT, ALKPHOS, BILITOT, PROT, ALBUMIN in the last 168 hours. No results for input(s): LIPASE, AMYLASE in the last 168 hours. No results for input(s): AMMONIA in the last 168 hours.  CBC: Recent Labs  Lab 11/13/18 0953 11/17/18 0606  WBC 13.0* 14.9*  HGB 12.1* 10.3*  HCT 37.6* 32.7*  MCV 96.7 95.9  PLT 234 235     Cardiac Enzymes: No results for input(s): CKTOTAL, CKMB, CKMBINDEX, TROPONINI in the last 168 hours.  BNP (last 3 results) No results for input(s): BNP in the last 8760 hours.  ProBNP (last 3 results) No results for input(s): PROBNP in the last 8760 hours.  Radiological Exams: Ct Head Wo Contrast  Result Date: 11/16/2018 CLINICAL DATA:  Follow up CVA. EXAM: CT HEAD WITHOUT CONTRAST TECHNIQUE: Contiguous axial images were obtained from the base of the skull through the vertex without intravenous contrast. COMPARISON:  None available. FINDINGS: Brain: Remote appearing left temporoparietal infarct involving the basal ganglia with encephalomalacia and ex vacuo dilatation of the left lateral ventricle. No hemorrhage is identified. No new/acute intracranial findings and no extra-axial fluid collections are identified. Evidence of prior craniotomies. The brainstem and cerebellum are grossly normal. Vascular: Stable vascular calcifications. No focal aneurysm or hyperdense vessels. Skull: No skull fracture or bone lesions. Left frontal and right high parietal craniotomies are noted. Sinuses/Orbits: The paranasal sinuses and mastoid air cells are clear except for small mastoid effusions on the right. The globes are intact. Other: No scalp lesions or hematoma. IMPRESSION: 1. Fairly extensive, chronic appearing, left-sided infarct with encephalomalacia and ex vacuo dilatation of the left lateral ventricle. 2. No acute intracranial findings, intracranial hemorrhage or extra-axial fluid collections. Electronically Signed   By: Rudie Meyer M.D.   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 Acute on chronic respiratory failure with hypoxia we  will continue with capping working towards eventual decannulation right now 48-hour mark. 2. Nontraumatic intracranial hemorrhage slowly showing some improvement we will continue with supportive care 3. ATN improved 4. DVT treated improved 5. Anoxic encephalopathy slowly improved   I have personally seen and evaluated the patient, evaluated laboratory and imaging results, formulated the assessment and plan and placed orders. The Patient requires high complexity decision making for assessment and support.  Case was discussed on Rounds with the Respiratory Therapy Staff  Yevonne Pax, MD [facility] Pulmonary Critical Care Medicine Sleep Medicine

## 2018-11-18 DIAGNOSIS — J9621 Acute and chronic respiratory failure with hypoxia: Secondary | ICD-10-CM | POA: Diagnosis not present

## 2018-11-18 DIAGNOSIS — G931 Anoxic brain damage, not elsewhere classified: Secondary | ICD-10-CM | POA: Diagnosis not present

## 2018-11-18 DIAGNOSIS — I824Z3 Acute embolism and thrombosis of unspecified deep veins of distal lower extremity, bilateral: Secondary | ICD-10-CM | POA: Diagnosis not present

## 2018-11-18 DIAGNOSIS — N170 Acute kidney failure with tubular necrosis: Secondary | ICD-10-CM | POA: Diagnosis not present

## 2018-11-18 NOTE — Progress Notes (Addendum)
Pulmonary Critical Care Medicine [facility] [place]   PULMONARY CRITICAL CARE SERVICE  PROGRESS NOTE  Date of Service: 11/18/2018  Darryl Vargas  [id number]:[id number]  DOB: Mar 18, 1951   DOA: 10/19/2018  Referring Physician: Carron Curie, MD  HPI: Darryl Vargas is a 68 y.o. male seen for follow up of Acute on Chronic Respiratory Failure.  Patient is doing well currently on room air and has been capping with good results for 3 days.  Minimal to no secretions noted.  Medications: Reviewed on Rounds  Physical Exam:  Vitals: Pulse 91 respirations 28 blood pressure 142/69 O2 sat 97% temp 100.3  Ventilator Settings patient is not currently on ventilator  . General: Comfortable at this time . Eyes: Grossly normal lids, irises & conjunctiva . ENT: grossly tongue is normal . Neck: no obvious mass . Cardiovascular: S1 S2 normal no gallop . Respiratory: No wheezes rhonchi noted . Abdomen: soft . Skin: no rash seen on limited exam . Musculoskeletal: not rigid . Psychiatric:unable to assess . Neurologic: no seizure no involuntary movements         Lab Data:   Basic Metabolic Panel: Recent Labs  Lab 11/13/18 0953 11/17/18 0606  NA 140 140  K 3.8 3.3*  CL 107 107  CO2 18* 22  GLUCOSE 196* 157*  BUN 58* 72*  CREATININE 1.01 1.03  CALCIUM 9.3 9.1    ABG: No results for input(s): PHART, PCO2ART, PO2ART, HCO3, O2SAT in the last 168 hours.  Liver Function Tests: No results for input(s): AST, ALT, ALKPHOS, BILITOT, PROT, ALBUMIN in the last 168 hours. No results for input(s): LIPASE, AMYLASE in the last 168 hours. No results for input(s): AMMONIA in the last 168 hours.  CBC: Recent Labs  Lab 11/13/18 0953 11/17/18 0606  WBC 13.0* 14.9*  HGB 12.1* 10.3*  HCT 37.6* 32.7*  MCV 96.7 95.9  PLT 234 235    Cardiac Enzymes: No results for input(s): CKTOTAL, CKMB, CKMBINDEX, TROPONINI in the last 168 hours.  BNP (last 3 results) No results for input(s): BNP in  the last 8760 hours.  ProBNP (last 3 results) No results for input(s): PROBNP in the last 8760 hours.  Radiological Exams: Ct Head Wo Contrast  Result Date: 11/16/2018 CLINICAL DATA:  Follow up CVA. EXAM: CT HEAD WITHOUT CONTRAST TECHNIQUE: Contiguous axial images were obtained from the base of the skull through the vertex without intravenous contrast. COMPARISON:  None available. FINDINGS: Brain: Remote appearing left temporoparietal infarct involving the basal ganglia with encephalomalacia and ex vacuo dilatation of the left lateral ventricle. No hemorrhage is identified. No new/acute intracranial findings and no extra-axial fluid collections are identified. Evidence of prior craniotomies. The brainstem and cerebellum are grossly normal. Vascular: Stable vascular calcifications. No focal aneurysm or hyperdense vessels. Skull: No skull fracture or bone lesions. Left frontal and right high parietal craniotomies are noted. Sinuses/Orbits: The paranasal sinuses and mastoid air cells are clear except for small mastoid effusions on the right. The globes are intact. Other: No scalp lesions or hematoma. IMPRESSION: 1. Fairly extensive, chronic appearing, left-sided infarct with encephalomalacia and ex vacuo dilatation of the left lateral ventricle. 2. No acute intracranial findings, intracranial hemorrhage or extra-axial fluid collections. Electronically Signed   By: Rudie Meyer M.D.   On: 11/16/2018 16:09   Dg Chest Port 1 View  Result Date: 11/17/2018 CLINICAL DATA:  68 y/o M; acute on chronic respiratory failure with hypoxia. EXAM: PORTABLE CHEST 1 VIEW COMPARISON:  11/08/2018 chest radiograph FINDINGS: Enlarged  cardiac silhouette. Tracheostomy tube tip projects over the tracheal air column. Linear opacity in left lung base, likely atelectasis. No pleural effusion or pneumothorax. No acute osseous abnormality identified. IMPRESSION: Enlarged cardiac silhouette. Left basilar atelectasis. Electronically  Signed   By: Mitzi HansenLance  Furusawa-Stratton M.D.   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 Acute on chronic respiratory failure with hypoxia continue capping to work towards decannulation.  Patient has reached 72-hour mark at this time. 2. Nontraumatic intracranial hemorrhage slowly showing some improvement continue supportive care 3. ATN improved 4. DVT treated improved 5. Anoxic encephalopathy slowly improved   I have personally seen and evaluated the patient, evaluated laboratory and imaging results, formulated the assessment and plan and placed orders. The Patient requires high complexity decision making for assessment and support.  Case was discussed on Rounds with the Respiratory Therapy Staff  Yevonne PaxSaadat A , MD [facility] Pulmonary Critical Care Medicine Sleep Medicine

## 2018-11-19 DIAGNOSIS — I824Z3 Acute embolism and thrombosis of unspecified deep veins of distal lower extremity, bilateral: Secondary | ICD-10-CM | POA: Diagnosis not present

## 2018-11-19 DIAGNOSIS — G931 Anoxic brain damage, not elsewhere classified: Secondary | ICD-10-CM | POA: Diagnosis not present

## 2018-11-19 DIAGNOSIS — J9621 Acute and chronic respiratory failure with hypoxia: Secondary | ICD-10-CM | POA: Diagnosis not present

## 2018-11-19 DIAGNOSIS — N170 Acute kidney failure with tubular necrosis: Secondary | ICD-10-CM | POA: Diagnosis not present

## 2018-11-19 LAB — URINE CULTURE: Culture: NO GROWTH

## 2018-11-19 NOTE — Progress Notes (Addendum)
Pulmonary Critical Care Medicine [facility] [place]   PULMONARY CRITICAL CARE SERVICE  PROGRESS NOTE  Date of Service: 11/19/2018  Darryl Vargas  [id number]  DOB: 1950/11/24   DOA: 10/19/2018  Referring Physician: Carron Curie, MD  HPI: Darryl Vargas is a 68 y.o. male seen for follow up of Acute on Chronic Respiratory Failure.  Patient continues to do well has been capped x4 days.  RT does report that when Cap is removed he has pooled secretions in the back of his throat.  Medications: Reviewed on Rounds  Physical Exam:  Vitals: Pulse 77 respirations 23 blood pressure 112/65 O2 sat 98% temp 98.2  Ventilator Settings patient not currently on ventilator  . General: Comfortable at this time . Eyes: Grossly normal lids, irises & conjunctiva . ENT: grossly tongue is normal . Neck: no obvious mass . Cardiovascular: S1 S2 normal no gallop . Respiratory: No wheezes or rhonchi noted . Abdomen: soft . Skin: no rash seen on limited exam . Musculoskeletal: not rigid . Psychiatric:unable to assess . Neurologic: no seizure no involuntary movements         Lab Data:   Basic Metabolic Panel: Recent Labs  Lab 11/13/18 0953 11/17/18 0606  NA 140 140  K 3.8 3.3*  CL 107 107  CO2 18* 22  GLUCOSE 196* 157*  BUN 58* 72*  CREATININE 1.01 1.03  CALCIUM 9.3 9.1    ABG: No results for input(s): PHART, PCO2ART, PO2ART, HCO3, O2SAT in the last 168 hours.  Liver Function Tests: No results for input(s): AST, ALT, ALKPHOS, BILITOT, PROT, ALBUMIN in the last 168 hours. No results for input(s): LIPASE, AMYLASE in the last 168 hours. No results for input(s): AMMONIA in the last 168 hours.  CBC: Recent Labs  Lab 11/13/18 0953 11/17/18 0606  WBC 13.0* 14.9*  HGB 12.1* 10.3*  HCT 37.6* 32.7*  MCV 96.7 95.9  PLT 234 235    Cardiac Enzymes: No results for input(s): CKTOTAL, CKMB, CKMBINDEX, TROPONINI in the last 168 hours.  BNP (last 3 results) No results  for input(s): BNP in the last 8760 hours.  ProBNP (last 3 results) No results for input(s): PROBNP in the last 8760 hours.  Radiological Exams: No results found.  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 Acute on chronic respiratory failure with hypoxia continue capping and work towards decannulation.  Patient has been capped x4 days however does have moderate to heavy secretions. 2. Nontraumatic intracranial hemorrhage slowly showing some improvement continue supportive care 3. ATN improved 4. DVT treated improved 5. Anoxic encephalopathy slowly improved   I have personally seen and evaluated the patient, evaluated laboratory and imaging results, formulated the assessment and plan and placed orders. The Patient requires high complexity decision making for assessment and support.  Case was discussed on Rounds with the Respiratory Therapy Staff  Yevonne Pax, MD [facility] Pulmonary Critical Care Medicine Sleep Medicine

## 2018-11-20 DIAGNOSIS — N170 Acute kidney failure with tubular necrosis: Secondary | ICD-10-CM | POA: Diagnosis not present

## 2018-11-20 DIAGNOSIS — G931 Anoxic brain damage, not elsewhere classified: Secondary | ICD-10-CM | POA: Diagnosis not present

## 2018-11-20 DIAGNOSIS — J9621 Acute and chronic respiratory failure with hypoxia: Secondary | ICD-10-CM | POA: Diagnosis not present

## 2018-11-20 DIAGNOSIS — I824Z3 Acute embolism and thrombosis of unspecified deep veins of distal lower extremity, bilateral: Secondary | ICD-10-CM | POA: Diagnosis not present

## 2018-11-20 NOTE — Progress Notes (Signed)
Pulmonary Critical Care Medicine [facility] [place]   PULMONARY CRITICAL CARE SERVICE  PROGRESS NOTE  Date of Service: 11/20/2018  Darryl Vargas  [id number]  DOB: May 13, 1951   DOA: 10/19/2018  Referring Physician: Carron Curie, MD  HPI: Darryl Vargas is a 68 y.o. male seen for follow up of Acute on Chronic Respiratory Failure.  Currently is on capping has been doing well for 6 days but should be able to go ahead and decannulate  Medications: Reviewed on Rounds  Physical Exam:  Vitals: Temperature 97.4 pulse 86 respiratory rate 24 blood pressure 104/58 saturations 98%  Ventilator Settings off the ventilator capping  . General: Comfortable at this time . Eyes: Grossly normal lids, irises & conjunctiva . ENT: grossly tongue is normal . Neck: no obvious mass . Cardiovascular: S1 S2 normal no gallop . Respiratory: Scattered rhonchi expansion is equal . Abdomen: soft . Skin: no rash seen on limited exam . Musculoskeletal: not rigid . Psychiatric:unable to assess . Neurologic: no seizure no involuntary movements         Lab Data:   Basic Metabolic Panel: Recent Labs  Lab 11/17/18 0606  NA 140  K 3.3*  CL 107  CO2 22  GLUCOSE 157*  BUN 72*  CREATININE 1.03  CALCIUM 9.1    ABG: No results for input(s): PHART, PCO2ART, PO2ART, HCO3, O2SAT in the last 168 hours.  Liver Function Tests: No results for input(s): AST, ALT, ALKPHOS, BILITOT, PROT, ALBUMIN in the last 168 hours. No results for input(s): LIPASE, AMYLASE in the last 168 hours. No results for input(s): AMMONIA in the last 168 hours.  CBC: Recent Labs  Lab 11/17/18 0606  WBC 14.9*  HGB 10.3*  HCT 32.7*  MCV 95.9  PLT 235    Cardiac Enzymes: No results for input(s): CKTOTAL, CKMB, CKMBINDEX, TROPONINI in the last 168 hours.  BNP (last 3 results) No results for input(s): BNP in the last 8760 hours.  ProBNP (last 3 results) No results for input(s): PROBNP in the last 8760  hours.  Radiological Exams: No results found.  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 Acute on chronic respiratory failure with hypoxia doing fine with the capping trials he has been more receptive we will continue to advance the wean and proceed to decannulation 2. Nontraumatic intracranial hemorrhage continue with supportive care therapy 3. ATN improving 4. DVT treated 5. Acute encephalopathy shows some clearing   I have personally seen and evaluated the patient, evaluated laboratory and imaging results, formulated the assessment and plan and placed orders. The Patient requires high complexity decision making for assessment and support.  Case was discussed on Rounds with the Respiratory Therapy Staff  Yevonne Pax, MD [facility] Pulmonary Critical Care Medicine Sleep Medicine

## 2018-11-21 DIAGNOSIS — N170 Acute kidney failure with tubular necrosis: Secondary | ICD-10-CM | POA: Diagnosis not present

## 2018-11-21 DIAGNOSIS — J9621 Acute and chronic respiratory failure with hypoxia: Secondary | ICD-10-CM | POA: Diagnosis not present

## 2018-11-21 DIAGNOSIS — I824Z3 Acute embolism and thrombosis of unspecified deep veins of distal lower extremity, bilateral: Secondary | ICD-10-CM | POA: Diagnosis not present

## 2018-11-21 DIAGNOSIS — G931 Anoxic brain damage, not elsewhere classified: Secondary | ICD-10-CM | POA: Diagnosis not present

## 2018-11-21 NOTE — Progress Notes (Addendum)
Pulmonary Critical Care Medicine [facility] [place]   PULMONARY CRITICAL CARE SERVICE  PROGRESS NOTE  Date of Service: 11/21/2018  Darryl Vargas  [id number]  DOB: 19-Jul-1951   DOA: 10/19/2018  Referring Physician: Carron Curie, MD  HPI: Darryl Vargas is a 68 y.o. male seen for follow up of Acute on Chronic Respiratory Failure.  Attempted to decannulate patient today however after a few hours respiratory therapy was having to remove the bandage and suction secretions out of his airway through his tracheostomy stoma.  #4 cuffless trach was reinserted for this secretion management.  It was immediately capped and the patient remains on room air.  Medications: Reviewed on Rounds  Physical Exam:  Vitals: Pulse 87 respirations 24 blood pressure 118/63 O2 sat 97% temp 98.7  Ventilator Settings patient is not currently on ventilator  . General: Comfortable at this time . Eyes: Grossly normal lids, irises & conjunctiva . ENT: grossly tongue is normal . Neck: no obvious mass . Cardiovascular: S1 S2 normal no gallop . Respiratory: No rales or rhonchi noted . Abdomen: soft . Skin: no rash seen on limited exam . Musculoskeletal: not rigid . Psychiatric:unable to assess . Neurologic: no seizure no involuntary movements         Lab Data:   Basic Metabolic Panel: Recent Labs  Lab 11/17/18 0606  NA 140  K 3.3*  CL 107  CO2 22  GLUCOSE 157*  BUN 72*  CREATININE 1.03  CALCIUM 9.1    ABG: No results for input(s): PHART, PCO2ART, PO2ART, HCO3, O2SAT in the last 168 hours.  Liver Function Tests: No results for input(s): AST, ALT, ALKPHOS, BILITOT, PROT, ALBUMIN in the last 168 hours. No results for input(s): LIPASE, AMYLASE in the last 168 hours. No results for input(s): AMMONIA in the last 168 hours.  CBC: Recent Labs  Lab 11/17/18 0606  WBC 14.9*  HGB 10.3*  HCT 32.7*  MCV 95.9  PLT 235    Cardiac Enzymes: No results for input(s): CKTOTAL, CKMB,  CKMBINDEX, TROPONINI in the last 168 hours.  BNP (last 3 results) No results for input(s): BNP in the last 8760 hours.  ProBNP (last 3 results) No results for input(s): PROBNP in the last 8760 hours.  Radiological Exams: No results found.  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 Acute on chronic respiratory failure with hypoxia patient's trach reinserted after decannulation today due to secretions.  Patient remains with #4 cuffless and capped at this time. 2. Nontraumatic intracranial hemorrhage continue with supportive care and therapy 3. ATN improving 4. DVT treated 5. Acute encephalopathy slowly clearing.   I have personally seen and evaluated the patient, evaluated laboratory and imaging results, formulated the assessment and plan and placed orders. The Patient requires high complexity decision making for assessment and support.  Case was discussed on Rounds with the Respiratory Therapy Staff  Yevonne Pax, MD [facility] Pulmonary Critical Care Medicine Sleep Medicine

## 2018-11-22 DIAGNOSIS — N170 Acute kidney failure with tubular necrosis: Secondary | ICD-10-CM | POA: Diagnosis not present

## 2018-11-22 DIAGNOSIS — G931 Anoxic brain damage, not elsewhere classified: Secondary | ICD-10-CM | POA: Diagnosis not present

## 2018-11-22 DIAGNOSIS — J9621 Acute and chronic respiratory failure with hypoxia: Secondary | ICD-10-CM | POA: Diagnosis not present

## 2018-11-22 DIAGNOSIS — I824Z3 Acute embolism and thrombosis of unspecified deep veins of distal lower extremity, bilateral: Secondary | ICD-10-CM | POA: Diagnosis not present

## 2018-11-22 LAB — CBC
HEMATOCRIT: 30.9 % — AB (ref 39.0–52.0)
HEMOGLOBIN: 9.3 g/dL — AB (ref 13.0–17.0)
MCH: 30 pg (ref 26.0–34.0)
MCHC: 30.1 g/dL (ref 30.0–36.0)
MCV: 99.7 fL (ref 80.0–100.0)
Platelets: 307 10*3/uL (ref 150–400)
RBC: 3.1 MIL/uL — ABNORMAL LOW (ref 4.22–5.81)
RDW: 16.1 % — ABNORMAL HIGH (ref 11.5–15.5)
WBC: 13.4 10*3/uL — AB (ref 4.0–10.5)
nRBC: 0 % (ref 0.0–0.2)

## 2018-11-22 LAB — BASIC METABOLIC PANEL
Anion gap: 12 (ref 5–15)
BUN: 75 mg/dL — ABNORMAL HIGH (ref 8–23)
CO2: 21 mmol/L — ABNORMAL LOW (ref 22–32)
Calcium: 9.4 mg/dL (ref 8.9–10.3)
Chloride: 109 mmol/L (ref 98–111)
Creatinine, Ser: 0.98 mg/dL (ref 0.61–1.24)
GFR calc Af Amer: >60 mL/min (ref >60)
Glucose, Bld: 169 mg/dL — ABNORMAL HIGH (ref 70–99)
POTASSIUM: 3.7 mmol/L (ref 3.5–5.1)
Sodium: 142 mmol/L (ref 135–145)

## 2018-11-22 NOTE — Progress Notes (Addendum)
[facility] [place]   PULMONARY CRITICAL CARE SERVICE  PROGRESS NOTE  Date of Service: 11/22/2018  Gara KronerCharles D Testerman  AOZ:[id number]  DOB: 1951/05/19   DOA: 10/19/2018  Referring Physician: Carron CurieAli Hijazi, MD  HPI: Gara KronerCharles D Cotroneo is a 68 y.o. male seen for follow up of Acute on Chronic Respiratory Failure.  Patient continues to have #4 cuffless Shiley in place.  He wears PMV with no issues and has been suctioned some thick secretions throughout the day.  Medications: Reviewed on Rounds  Physical Exam:  Vitals: Pulse 75 respirations 30 O2 sat 96% blood pressure 120/55 temp 97.6  Ventilator Settings patient's not currently on ventilator  . General: Comfortable at this time . Eyes: Grossly normal lids, irises & conjunctiva . ENT: grossly tongue is normal . Neck: no obvious mass . Cardiovascular: S1 S2 normal no gallop . Respiratory: No rales or rhonchi noted . Abdomen: soft . Skin: no rash seen on limited exam . Musculoskeletal: not rigid . Psychiatric:unable to assess . Neurologic: no seizure no involuntary movements         Lab Data:   Basic Metabolic Panel: Recent Labs  Lab 11/17/18 0606 11/22/18 0757  NA 140 142  K 3.3* 3.7  CL 107 109  CO2 22 21*  GLUCOSE 157* 169*  BUN 72* 75*  CREATININE 1.03 0.98  CALCIUM 9.1 9.4    ABG: No results for input(s): PHART, PCO2ART, PO2ART, HCO3, O2SAT in the last 168 hours.  Liver Function Tests: No results for input(s): AST, ALT, ALKPHOS, BILITOT, PROT, ALBUMIN in the last 168 hours. No results for input(s): LIPASE, AMYLASE in the last 168 hours. No results for input(s): AMMONIA in the last 168 hours.  CBC: Recent Labs  Lab 11/17/18 0606 11/22/18 0757  WBC 14.9* 13.4*  HGB 10.3* 9.3*  HCT 32.7* 30.9*  MCV 95.9 99.7  PLT 235 307    Cardiac Enzymes: No results for input(s): CKTOTAL, CKMB, CKMBINDEX, TROPONINI in the last 168 hours.  BNP (last 3 results) No results  for input(s): BNP in the last 8760 hours.  ProBNP (last 3 results) No results for input(s): PROBNP in the last 8760 hours.  Radiological Exams: No results found.  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 Acute on chronic respiratory failure with hypoxia #4 cuffless Shiley in place and capped at this time.  Patient is on room air 2. Nontraumatic intracranial hemorrhage continue supportive care 3. ATN improving 4. DVT treated 5. Acute encephalopathy slowly clearing   I have personally seen and evaluated the patient, evaluated laboratory and imaging results, formulated the assessment and plan and placed orders. The Patient requires high complexity decision making for assessment and support.  Case was discussed on Rounds with the Respiratory Therapy Staff  Yevonne PaxSaadat A Manessa Buley, MD [facility] Pulmonary Critical Care Medicine Sleep Medicine

## 2018-11-23 DIAGNOSIS — N170 Acute kidney failure with tubular necrosis: Secondary | ICD-10-CM | POA: Diagnosis not present

## 2018-11-23 DIAGNOSIS — J9621 Acute and chronic respiratory failure with hypoxia: Secondary | ICD-10-CM | POA: Diagnosis not present

## 2018-11-23 DIAGNOSIS — I824Z3 Acute embolism and thrombosis of unspecified deep veins of distal lower extremity, bilateral: Secondary | ICD-10-CM | POA: Diagnosis not present

## 2018-11-23 DIAGNOSIS — G931 Anoxic brain damage, not elsewhere classified: Secondary | ICD-10-CM | POA: Diagnosis not present

## 2018-11-23 NOTE — Progress Notes (Signed)
Pulmonary Critical Care Medicine [facility] [place]   PULMONARY CRITICAL CARE SERVICE  PROGRESS NOTE  Date of Service: 11/23/2018  Darryl Vargas  [id number]  DOB: Feb 12, 1951   DOA: 10/19/2018  Referring Physician: Carron Curie, MD  HPI: Darryl Vargas is a 68 y.o. male seen for follow up of Acute on Chronic Respiratory Failure.  Patient right now is capping has been on room air doing very well.  Medications: Reviewed on Rounds  Physical Exam:  Vitals: Temperature 97.4 pulse 75 respiratory rate 28 blood pressure 108/58 saturations 97%  Ventilator Settings capping off the ventilator  . General: Comfortable at this time . Eyes: Grossly normal lids, irises & conjunctiva . ENT: grossly tongue is normal . Neck: no obvious mass . Cardiovascular: S1 S2 normal no gallop . Respiratory: No rhonchi or rales are noted at this time . Abdomen: soft . Skin: no rash seen on limited exam . Musculoskeletal: not rigid . Psychiatric:unable to assess . Neurologic: no seizure no involuntary movements         Lab Data:   Basic Metabolic Panel: Recent Labs  Lab 11/17/18 0606 11/22/18 0757  NA 140 142  K 3.3* 3.7  CL 107 109  CO2 22 21*  GLUCOSE 157* 169*  BUN 72* 75*  CREATININE 1.03 0.98  CALCIUM 9.1 9.4    ABG: No results for input(s): PHART, PCO2ART, PO2ART, HCO3, O2SAT in the last 168 hours.  Liver Function Tests: No results for input(s): AST, ALT, ALKPHOS, BILITOT, PROT, ALBUMIN in the last 168 hours. No results for input(s): LIPASE, AMYLASE in the last 168 hours. No results for input(s): AMMONIA in the last 168 hours.  CBC: Recent Labs  Lab 11/17/18 0606 11/22/18 0757  WBC 14.9* 13.4*  HGB 10.3* 9.3*  HCT 32.7* 30.9*  MCV 95.9 99.7  PLT 235 307    Cardiac Enzymes: No results for input(s): CKTOTAL, CKMB, CKMBINDEX, TROPONINI in the last 168 hours.  BNP (last 3 results) No results for input(s): BNP in the last 8760 hours.  ProBNP (last 3  results) No results for input(s): PROBNP in the last 8760 hours.  Radiological Exams: No results found.  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 Acute on chronic respiratory failure with hypoxia we will continue with capping trials continue pulmonary toilet secretion management.  Patient has failed decannulation so therefore we are going to leave the tracheostomy in for now for suction access 2. Nontraumatic intracranial hemorrhage grossly unchanged 3. ATN improved 4. DVT treated 5. Anoxic encephalopathy grossly unchanged remains nonverbal   I have personally seen and evaluated the patient, evaluated laboratory and imaging results, formulated the assessment and plan and placed orders. The Patient requires high complexity decision making for assessment and support.  Case was discussed on Rounds with the Respiratory Therapy Staff  Yevonne Pax, MD [facility] Pulmonary Critical Care Medicine Sleep Medicine

## 2018-11-24 DIAGNOSIS — J9621 Acute and chronic respiratory failure with hypoxia: Secondary | ICD-10-CM | POA: Diagnosis not present

## 2018-11-24 DIAGNOSIS — G931 Anoxic brain damage, not elsewhere classified: Secondary | ICD-10-CM | POA: Diagnosis not present

## 2018-11-24 DIAGNOSIS — I824Z3 Acute embolism and thrombosis of unspecified deep veins of distal lower extremity, bilateral: Secondary | ICD-10-CM | POA: Diagnosis not present

## 2018-11-24 DIAGNOSIS — N170 Acute kidney failure with tubular necrosis: Secondary | ICD-10-CM | POA: Diagnosis not present

## 2018-11-24 NOTE — Progress Notes (Signed)
Pulmonary Critical Care Medicine [facility] [place]   PULMONARY CRITICAL CARE SERVICE  PROGRESS NOTE  Date of Service: 11/24/2018  Darryl Vargas  [id number]  DOB: 1951/06/29   DOA: 10/19/2018  Referring Physician: Carron Curie, MD  HPI: Darryl Vargas is a 68 y.o. male seen for follow up of Acute on Chronic Respiratory Failure.  Patient is capping on room air and respiratory therapy reports that secretions are actually much improved  Medications: Reviewed on Rounds  Physical Exam:  Vitals: Temperature 98.4 pulse 73 respiratory 24 blood pressure 120/63 saturations 98%  Ventilator Settings capping off the ventilator  . General: Comfortable at this time . Eyes: Grossly normal lids, irises & conjunctiva . ENT: grossly tongue is normal . Neck: no obvious mass . Cardiovascular: S1 S2 normal no gallop . Respiratory: No rhonchi or rales are noted at this time . Abdomen: soft . Skin: no rash seen on limited exam . Musculoskeletal: not rigid . Psychiatric:unable to assess . Neurologic: no seizure no involuntary movements         Lab Data:   Basic Metabolic Panel: Recent Labs  Lab 11/22/18 0757  NA 142  K 3.7  CL 109  CO2 21*  GLUCOSE 169*  BUN 75*  CREATININE 0.98  CALCIUM 9.4    ABG: No results for input(s): PHART, PCO2ART, PO2ART, HCO3, O2SAT in the last 168 hours.  Liver Function Tests: No results for input(s): AST, ALT, ALKPHOS, BILITOT, PROT, ALBUMIN in the last 168 hours. No results for input(s): LIPASE, AMYLASE in the last 168 hours. No results for input(s): AMMONIA in the last 168 hours.  CBC: Recent Labs  Lab 11/22/18 0757  WBC 13.4*  HGB 9.3*  HCT 30.9*  MCV 99.7  PLT 307    Cardiac Enzymes: No results for input(s): CKTOTAL, CKMB, CKMBINDEX, TROPONINI in the last 168 hours.  BNP (last 3 results) No results for input(s): BNP in the last 8760 hours.  ProBNP (last 3 results) No results for input(s): PROBNP in the last 8760  hours.  Radiological Exams: No results found.  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 Acute on chronic respiratory failure with hypoxia we will continue with capping trials continue pulmonary toilet secretion management. 2. Nontraumatic intracranial hemorrhage at baseline we will continue supportive care 3. ATN treated we will follow 4. DVT treated we will continue with supportive care 5. Anoxic encephalopathy grossly unchanged we will continue to monitor   I have personally seen and evaluated the patient, evaluated laboratory and imaging results, formulated the assessment and plan and placed orders. The Patient requires high complexity decision making for assessment and support.  Case was discussed on Rounds with the Respiratory Therapy Staff  Yevonne Pax, MD [facility] Pulmonary Critical Care Medicine Sleep Medicine

## 2018-12-24 DEATH — deceased

## 2020-10-03 IMAGING — DX DG ABDOMEN 1V
1 series · 1 of 1 positions shown · IV contrast (isovue)
Comparison: None.

CLINICAL DATA: Evaluate PEG tube. 50 cc of Isovue-600 injected
through the PEG tube.

EXAM:
ABDOMEN - 1 VIEW

[abdomen kub]
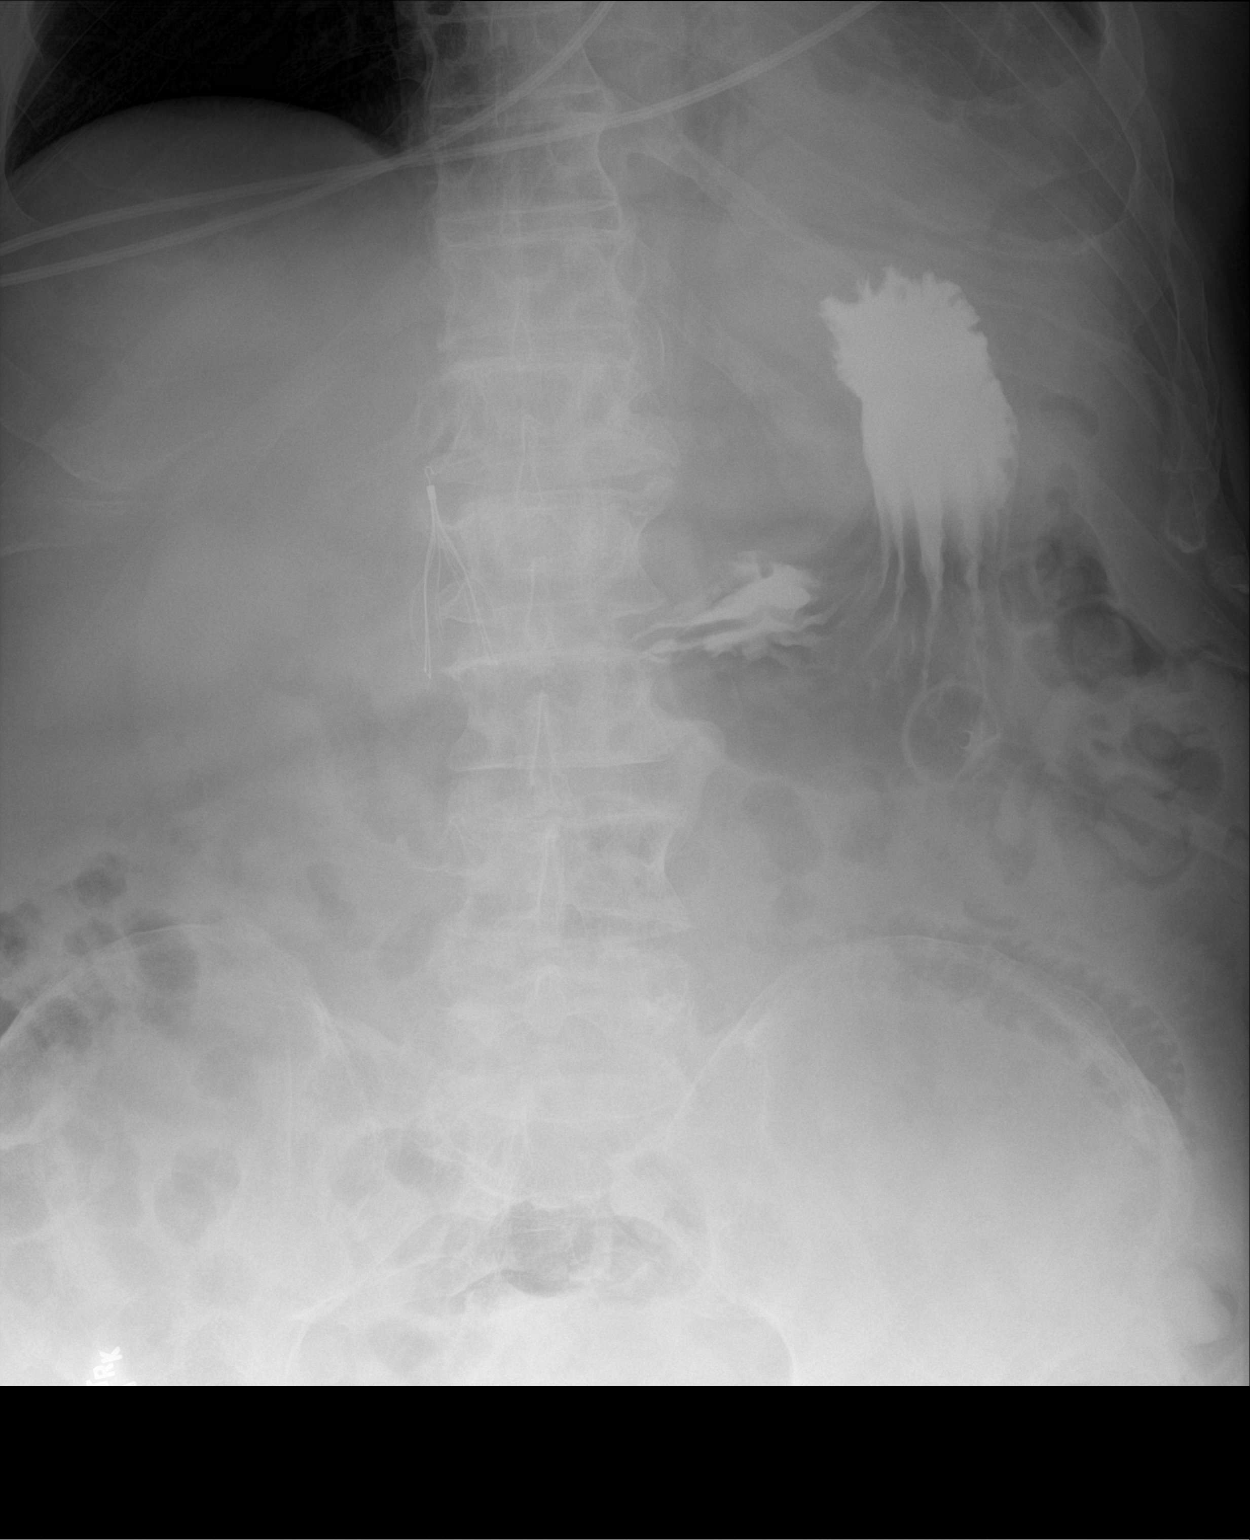

[1 of 1 positions shown; findings below may reference images not displayed]

FINDINGS: The PEG tube projects over the gastric body. The gastric fundus is
filled with contrast. No evidence of leak. The contrast remains in
the study and is not exited into the small bowel. IVC filter.
Suspected left basilar opacity, incompletely evaluated.
IMPRESSION: 1. The PEG tube appears to be located within the body of the
stomach. No evidence of leak.
2. Suspected opacity in left base, incompletely evaluated. Recommend
attention to this region on today's chest x-ray.

## 2020-10-31 IMAGING — CT CT HEAD W/O CM
4 series · 16 of 47 positions shown, 18 images · non-contrast
Comparison: None available.

CLINICAL DATA: Follow up CVA.

EXAM:
CT HEAD WITHOUT CONTRAST
TECHNIQUE: Contiguous axial images were obtained from the base of the skull
through the vertex without intravenous contrast.

[Series 3: head without · axial · non-contrast · 0.43mm/px · z∈[+1124,+1244]mm · 7 of 33 slices shown, 9 images]
[im 5/33  brain]
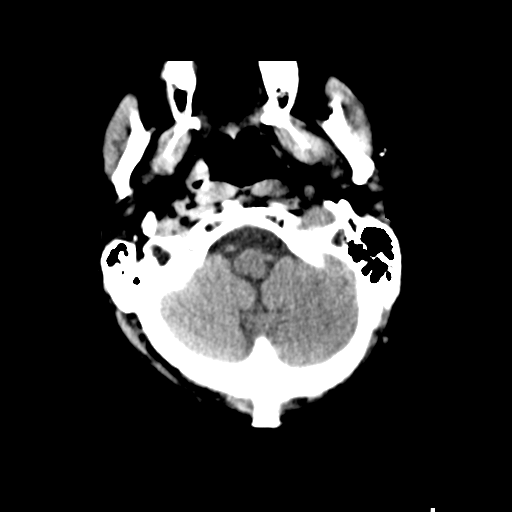
[im 5/33  bone]
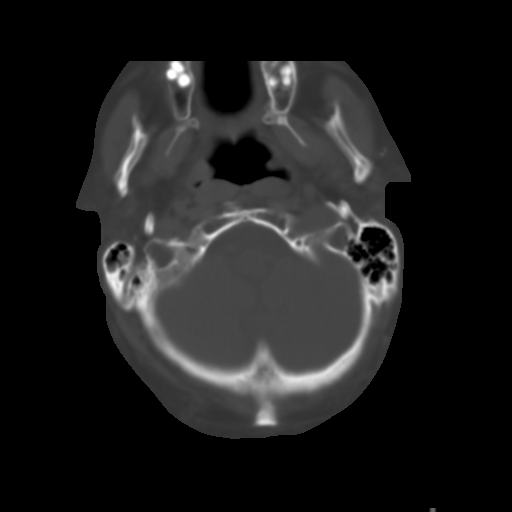
[im 9/33  brain]
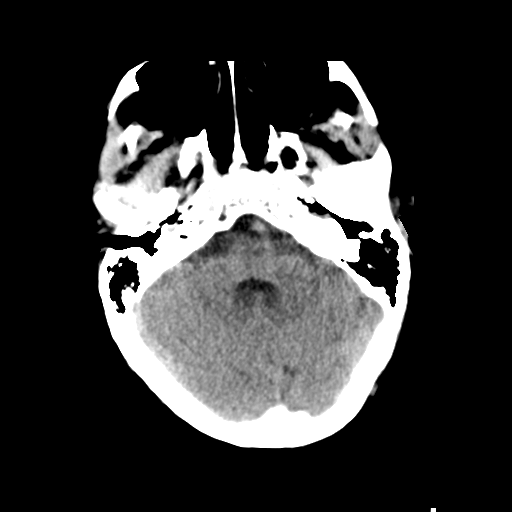
[im 13/33  brain]
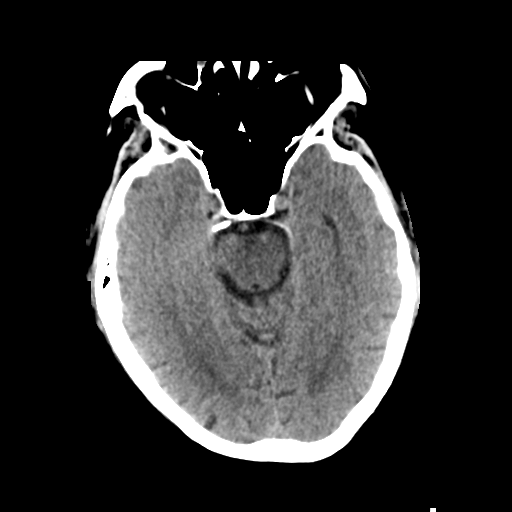
[im 17/33  brain]
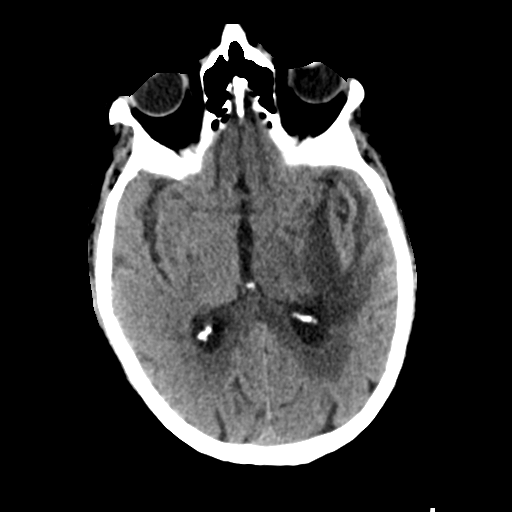
[im 21/33  brain]
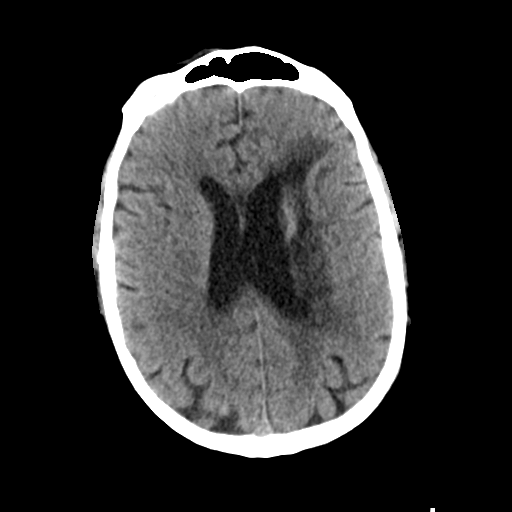
[im 21/33  bone]
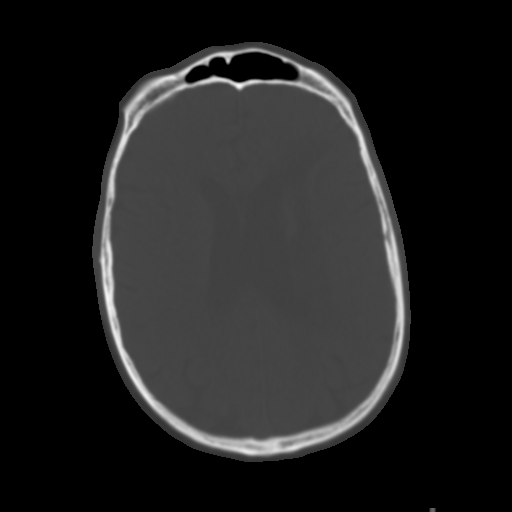
[im 25/33  brain]
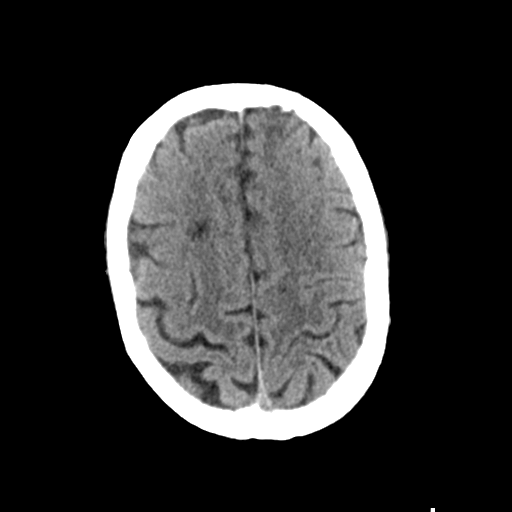
[im 29/33  brain]
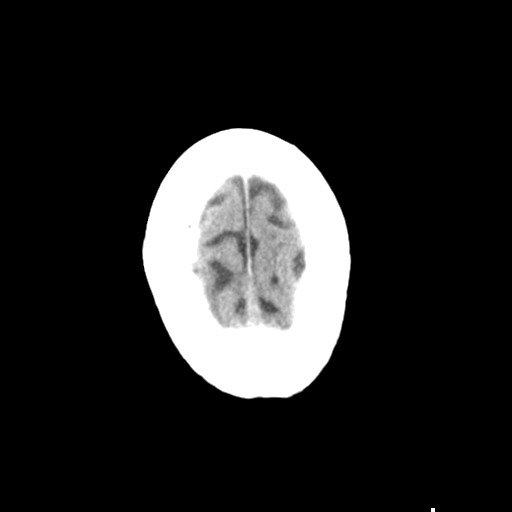

[Series 4: head bone · axial · 0.43mm/px · z∈[+1120,+1152]mm · 3 of 83 slices shown]
[im 9/83  bone]
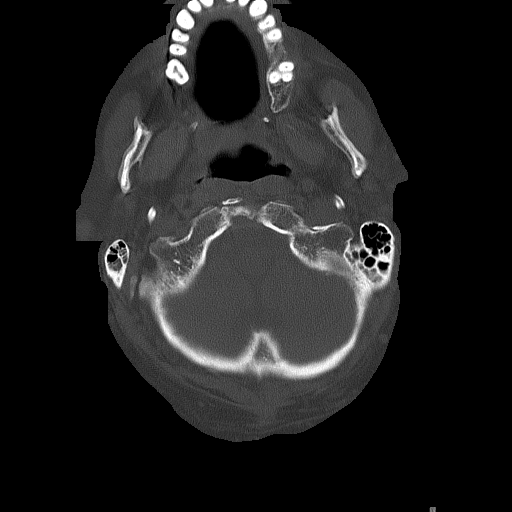
[im 17/83  bone]
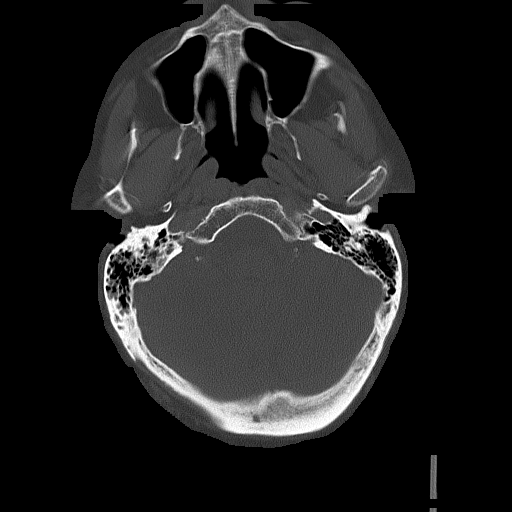
[im 25/83  bone]
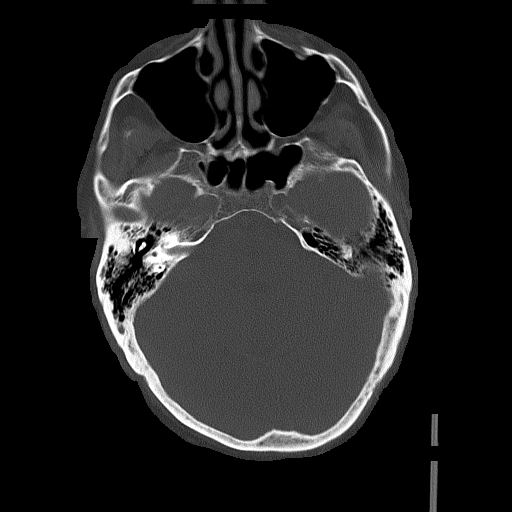

[Series 5: head without cor · coronal · non-contrast · 0.32mm/px · 3 of 67 slices shown]
[im 23/67  brain]
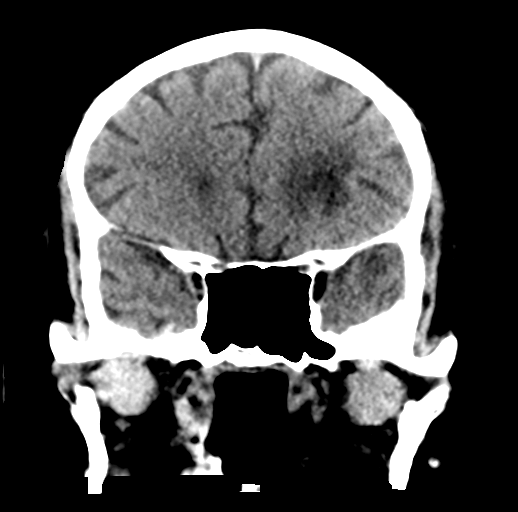
[im 30/67  brain]
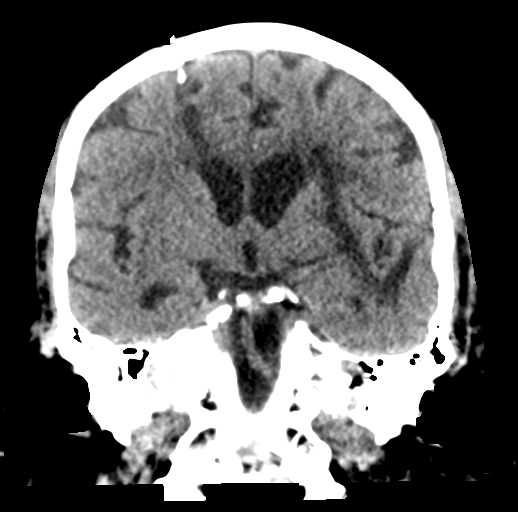
[im 37/67  brain]
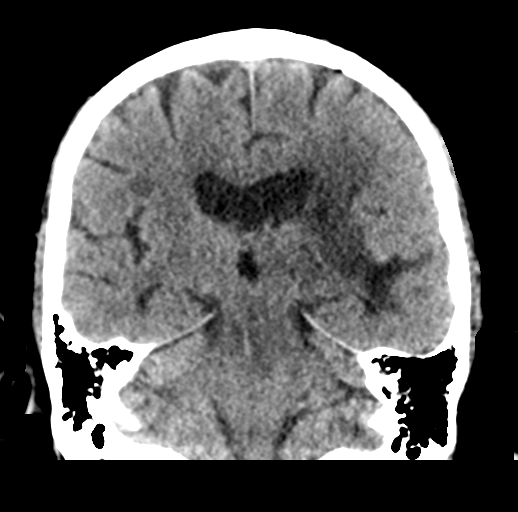

[Series 6: head without sag · sagittal · non-contrast · 0.32mm/px · 3 of 56 slices shown]
[im 19/56  brain]
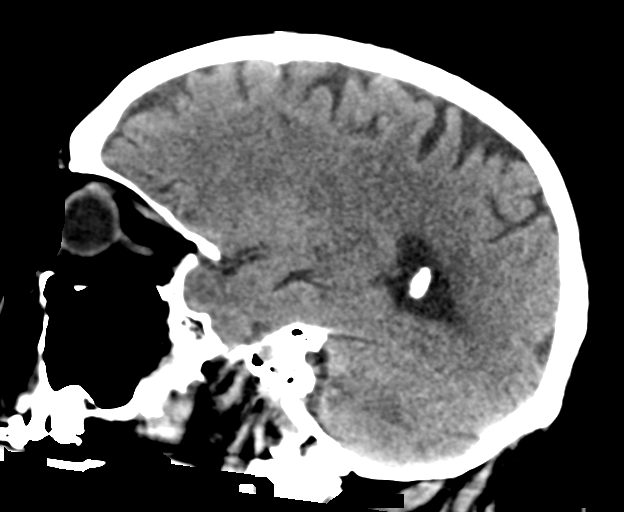
[im 28/56  brain]
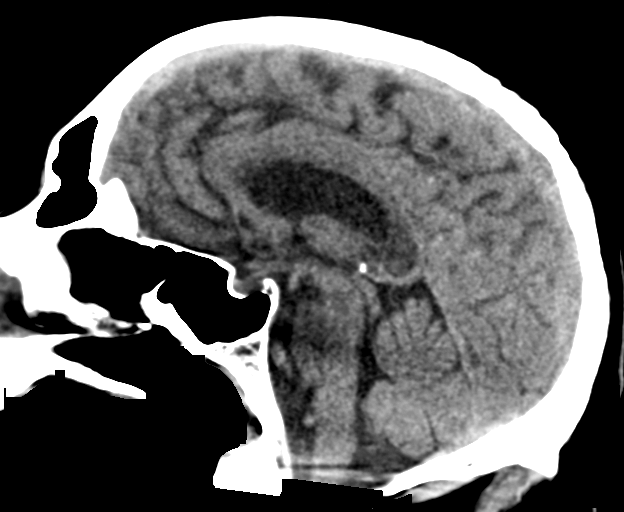
[im 37/56  brain]
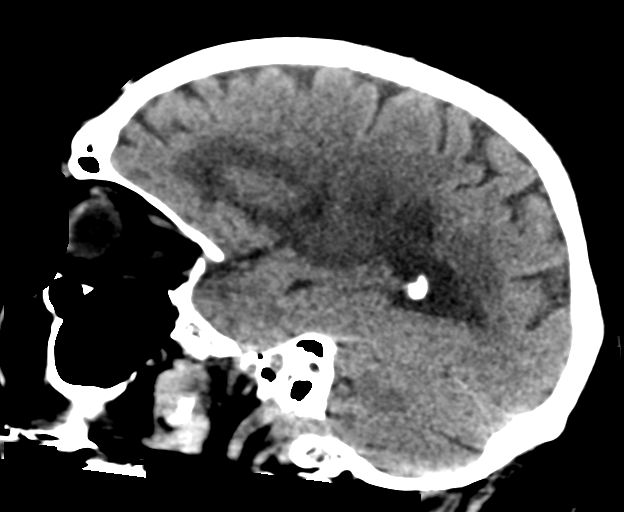

[16 of 47 positions shown; findings below may reference images not displayed]

FINDINGS: Brain: Remote appearing left temporoparietal infarct involving the
basal ganglia with encephalomalacia and ex vacuo dilatation of the
left lateral ventricle. No hemorrhage is identified. No new/acute
intracranial findings and no extra-axial fluid collections are
identified. Evidence of prior craniotomies.

The brainstem and cerebellum are grossly normal.

Vascular: Stable vascular calcifications. No focal aneurysm or
hyperdense vessels.

Skull: No skull fracture or bone lesions. Left frontal and right
high parietal craniotomies are noted.

Sinuses/Orbits: The paranasal sinuses and mastoid air cells are
clear except for small mastoid effusions on the right.. The globes
are intact.

Other: No scalp lesions or hematoma.
IMPRESSION: 1. Fairly extensive, chronic appearing, left-sided infarct with
encephalomalacia and ex vacuo dilatation of the left lateral
ventricle.
2. No acute intracranial findings, intracranial hemorrhage or
extra-axial fluid collections.
